# Patient Record
Sex: Male | Born: 1950 | Race: White | Hispanic: No | Marital: Married | Smoking: Former smoker
Health system: Southern US, Community
[De-identification: ages and names within clinical notes are randomized; demographics above are authoritative.]

## PROBLEM LIST (undated history)

## (undated) DIAGNOSIS — Z5189 Encounter for other specified aftercare: Secondary | ICD-10-CM

## (undated) DIAGNOSIS — F329 Major depressive disorder, single episode, unspecified: Secondary | ICD-10-CM

## (undated) DIAGNOSIS — K6289 Other specified diseases of anus and rectum: Secondary | ICD-10-CM

## (undated) DIAGNOSIS — T7840XA Allergy, unspecified, initial encounter: Secondary | ICD-10-CM

## (undated) DIAGNOSIS — M1990 Unspecified osteoarthritis, unspecified site: Secondary | ICD-10-CM

## (undated) DIAGNOSIS — I10 Essential (primary) hypertension: Secondary | ICD-10-CM

## (undated) DIAGNOSIS — F419 Anxiety disorder, unspecified: Secondary | ICD-10-CM

## (undated) DIAGNOSIS — F32A Depression, unspecified: Secondary | ICD-10-CM

## (undated) HISTORY — DX: Encounter for other specified aftercare: Z51.89

## (undated) HISTORY — DX: Anxiety disorder, unspecified: F41.9

## (undated) HISTORY — DX: Major depressive disorder, single episode, unspecified: F32.9

## (undated) HISTORY — DX: Depression, unspecified: F32.A

## (undated) HISTORY — DX: Essential (primary) hypertension: I10

## (undated) HISTORY — DX: Allergy, unspecified, initial encounter: T78.40XA

## (undated) HISTORY — DX: Other specified diseases of anus and rectum: K62.89

## (undated) HISTORY — DX: Unspecified osteoarthritis, unspecified site: M19.90

## (undated) HISTORY — PX: HERNIA REPAIR: SHX51

## (undated) HISTORY — PX: FRACTURE SURGERY: SHX138

---

## 1999-06-24 ENCOUNTER — Encounter: Admission: RE | Admit: 1999-06-24 | Discharge: 1999-06-24 | Payer: Self-pay | Admitting: Internal Medicine

## 2006-07-28 ENCOUNTER — Ambulatory Visit: Payer: Self-pay | Admitting: Internal Medicine

## 2006-08-10 ENCOUNTER — Ambulatory Visit: Payer: Self-pay

## 2006-09-02 ENCOUNTER — Ambulatory Visit: Payer: Self-pay | Admitting: Internal Medicine

## 2006-11-25 ENCOUNTER — Ambulatory Visit: Payer: Self-pay | Admitting: Internal Medicine

## 2006-12-01 ENCOUNTER — Ambulatory Visit: Payer: Self-pay | Admitting: Internal Medicine

## 2007-09-01 ENCOUNTER — Ambulatory Visit: Payer: Self-pay | Admitting: Internal Medicine

## 2008-08-22 ENCOUNTER — Ambulatory Visit: Payer: Self-pay | Admitting: Internal Medicine

## 2008-08-22 LAB — CONVERTED CEMR LAB
ALT: 37 units/L (ref 0–53)
Alkaline Phosphatase: 47 units/L (ref 39–117)
Bilirubin, Direct: 0.2 mg/dL (ref 0.0–0.3)
CO2: 33 meq/L — ABNORMAL HIGH (ref 19–32)
Calcium: 9.4 mg/dL (ref 8.4–10.5)
Glucose, Bld: 138 mg/dL — ABNORMAL HIGH (ref 70–99)
HDL: 39.6 mg/dL (ref >39.0)
Sodium: 142 meq/L (ref 135–145)
Total Protein: 7.1 g/dL (ref 6.0–8.3)
Triglycerides: 77 mg/dL (ref 0–149)

## 2008-08-24 ENCOUNTER — Ambulatory Visit: Payer: Self-pay | Admitting: Internal Medicine

## 2009-06-28 ENCOUNTER — Ambulatory Visit: Payer: Self-pay | Admitting: Internal Medicine

## 2009-07-04 ENCOUNTER — Ambulatory Visit: Payer: Self-pay | Admitting: Internal Medicine

## 2009-07-04 DIAGNOSIS — E785 Hyperlipidemia, unspecified: Secondary | ICD-10-CM | POA: Insufficient documentation

## 2009-07-04 DIAGNOSIS — I10 Essential (primary) hypertension: Secondary | ICD-10-CM | POA: Insufficient documentation

## 2009-07-04 LAB — CONVERTED CEMR LAB
ALT: 25 units/L (ref 0–53)
AST: 24 units/L (ref 0–37)
Alkaline Phosphatase: 63 units/L (ref 39–117)
BUN: 17 mg/dL (ref 6–23)
Bilirubin, Direct: 0.2 mg/dL (ref 0.0–0.3)
Calcium: 9.5 mg/dL (ref 8.4–10.5)
Cholesterol: 147 mg/dL (ref 0–200)
Creatinine, Ser: 0.8 mg/dL (ref 0.4–1.5)
GFR calc non Af Amer: 106.2 mL/min (ref >60)
Potassium: 4.3 meq/L (ref 3.5–5.1)
Total Protein: 7.1 g/dL (ref 6.0–8.3)

## 2009-10-18 ENCOUNTER — Encounter (INDEPENDENT_AMBULATORY_CARE_PROVIDER_SITE_OTHER): Payer: Self-pay | Admitting: *Deleted

## 2010-04-16 ENCOUNTER — Encounter: Payer: Self-pay | Admitting: Internal Medicine

## 2010-05-01 ENCOUNTER — Encounter: Payer: Self-pay | Admitting: Internal Medicine

## 2010-12-17 NOTE — Letter (Signed)
Summary: lab results  [facility], Main Office  [address]   [place], [place] [address]   Phone: [phone]  Fax: [phone]        May 01, 2010 MRN: [id number]    [name] [name] [address] [place], [place]  [address]    Dear [name],  We have been unable to reach you by phone regarding lab results.  Please give our office a call to discuss your results.     Sincerely,  Meredith Staggers, RN Arvilla Meres, MD  This letter has been electronically signed by your physician.

## 2011-04-01 NOTE — Assessment & Plan Note (Signed)
[facility]                            CARDIOLOGY OFFICE NOTE   [name] [name], [name] [name]                    MRN:          [id number]  DATE:08/24/2008                            DOB:          01-07-1951    PRIMARY CARE PHYSICIAN:  [name] Raveling, PA   INTERVAL HISTORY:  [name] [name] is a very pleasant 60 year old male with  history of hypertension, hyperlipidemia, obesity, obstructive sleep  apnea, and previous tobacco use who returns today for routine followup.   Overall, he has been doing fairly well.  Unfortunately, there has been  some illness in his family and he has not been as compliant with his  weight loss program he would like.  He has gained about 10 or 15 pounds  over the last few months.  He has been taking his blood pressure  regularly and systolics had been running in the 145s with diastolics  back in the 80s.  He denies any chest pain or shortness of breath.  He  did have blood work drawn couple of days ago which showed blood glucose  of 138, cholesterol of 143, triglycerides 107, HDL 45, and LDL 137, and  these were not strictly fasting, did have appearance of milk beforehand.   REVIEW OF SYSTEMS:  Otherwise negative.   CURRENT MEDICATIONS:  1. Lexapro 10 mg a day.  2. Benadryl 25 at night.  3. Claritin.  4. Aspirin 81 b.i.d.  5. B complex.  6. Vitamin.  7. Norvasc 10 a day.  8. Hyzaar 100/25.  9. Fish oil 3 g b.i.d.  10.Labetalol 200 b.i.d.   ALLERGIES/ INTOLERANCES:  SEPTRA DS, SIMVASTATIN, and PRAVACHOL caused  memory problems.   PHYSICAL EXAMINATION:  GENERAL:  He is well-appearing in no acute  distress, ambulates around the clinic without any respiratory  difficulty.  VITAL SIGNS:  Blood pressure is 134/80, heart rate 63, weight is 268.  HEENT:  Normal.  NECK:  Supple.  No obvious JVD.  Carotids are 2+ bilaterally without  bruits.  There is no lymphadenopathy or thyromegaly.  CARDIAC:  PMI is nondisplaced.  He is  regular with no murmurs, rubs, or  gallops.  LUNGS:  Clear.  ABDOMEN:  Obese, good bowel sounds, nontender, no obvious  hepatosplenomegaly.  EXTREMITIES:  Warm with no cyanosis, clubbing, or edema.  No rash.  NEURO:  Alert and oriented x3.  Cranial nerves II through XII are  intact.  Moves all 4 extremities without difficulty.  Affect is  pleasant.   EKG shows sinus rhythm at rate of 63.  No ST-T wave abnormalities.   ASSESSMENT AND PLAN:  1. Hyperlipidemia.  His LDL is not at goal.  He is unfortunately      unable to tolerate statins previously.  We will try him on very low-      dose Crestor 5 mg a day with the goal of getting his LDL under 100,      if he is unable tolerate this we have talked about Zetia.  2. Hypertension.  Blood pressure is elevated.  We will start      spironolactone 25  a day.  He will have followup on his electrolytes      within 2 weeks with his primary care physician.  3. Hyperglycemia.  Although, this was not strictly fasting.  His      glucose is elevated.  I discussed this with him.  He does have a      monitor at home and will check his blood sugars and follow up with      Ms. [name] [name].  He is hopefully motivated to be more compliant with      diet and exercise and an effort to lose weight and control his      sugars.  We did discuss briefly the diabetes lifestyle program in      consideration of early therapy with metformin progression to formal      diabetes.  I will leave this to Ms. [name] [name] to the side.   DISPOSITION:  We will see him back in 4-5 months with repeat lipid  profile, hemoglobin A1c, and CRP.     [name] Buckles. Bensimhon, MD  Electronically Signed    DRB/MedQ  DD: 08/24/2008  DT: 08/25/2008  Job #: [id number]

## 2011-04-01 NOTE — Assessment & Plan Note (Signed)
[facility]                            CARDIOLOGY OFFICE NOTE   [name], [name]                    MRN:          [id number]  DATE:09/01/2007                            DOB:          [birth year]    PRIMARY CARE PHYSICIAN:  Dr. Tracey [name].   INTERVAL HISTORY:  Overall he is a very pleasant 60 year old male who is  good friends with [name] [name] who presents today for a routine  followup.  He has a history of hypertension, hyperlipidemia, obesity,  obstructive sleep apnea and tobacco use which he has now quit.   He is doing fairly well although he has been under a tremendous amount  of stress as his niece just got into a severe motor vehicle accident, is  in the Pediatric ICU at [facility] with a severe head injury.  He has been  taking his blood pressure with his wife's cuff and notes over the past  few weeks it has been running in the 130-150 range, but he is not sure  if this is accurate.  He has been compliant with all his medications.  He did stop exercising for a while because he has hurt his knee but  recently got a cortisone shot and is back to exercising.  He continues  to ride his bike back and forth to work.   CURRENT MEDICATIONS:  1. Multivitamin.  2. Coenzyme Q10.  3. Fish oil.  4. Labetalol 200 b.i.d.  5. Norvasc 10 a day.  6. Aspirin 81 a day.  7. Hyzaar/HCTZ 100/25.   PHYSICAL EXAMINATION:  He is well-appearing, no acute distress,  ambulates around the clinic without any respiratory difficulty.  Blood  pressure is initially 112/72, on manual recheck 126/72.  Heart rate is  67, weight is 272 which is stable.  HEENT:  Normal.  NECK:  Supple, no JVD, carotids are 2+ bilaterally without any bruits,  there is no lymphadenopathy or thyromegaly.  CARDIAC:  PMI is nondisplaced, regular rate and rhythm; no murmurs,  rubs, or gallops.  LUNGS:  Clear.  ABDOMEN:  Obese, nontender, nondistended, no hepatosplenomegaly, no  bruits, no  masses, good bowel sounds.  EXTREMITIES:  Warm with no cyanosis, clubbing or edema.  NEURO:  He is alert and oriented x3, cranial nerves II through XII are  intact, moves all 4 extremities without difficulty.  Affect is pleasant.   EKG shows sinus rhythm at a rate of 67, no ST-T wave abnormalities.   ASSESSMENT/PLAN:  1. Hypertension.  Blood pressure seems to be a bit elevated at home      but it is doing very well here.  I have encouraged him to bring his      cuff in to be checked.  He will do this.  We will continue      medications where they are at for now.  2. Hyperlipidemia.  He stopped Pravachol due to mental status changes.      He did increase his fish oil, we will recheck his lipids.  I had      previously given him a prescription for low dose  Crestor but he is      going to hold off on this until we see where he is at.  Could also      consider Zetia.   DISPOSITION:  Return to clinic in 6 months.     [name] Buckles. Bensimhon, MD  Electronically Signed    DRB/MedQ  DD: 09/01/2007  DT: 09/02/2007  Job #: [id number]   cc:   [name] [name], M.D.

## 2011-04-04 NOTE — Assessment & Plan Note (Signed)
[name] [name]                              CARDIOLOGY OFFICE NOTE   [name] [name], [name] [name]                    MRN:          [id number]  DATE:07/28/2006                            DOB:          1951/07/28    REFERRING PHYSICIAN:  Tracey Harries, M.D.   CARDIOLOGY CONSULT.   PATIENT INFORMATION:  [name] [name] is a very pleasant 60 year old male who  presents for routine cardiac evaluation.   HISTORY OF PRESENT ILLNESS:  [name] [name] is 60 years old.  He has a history  of hypertension, sleep apnea and obesity.  He denies any known history of  heart disease.  Thirty years ago he did undergo a full workup, including  echocardiogram, treadmill and 24 hour Holter monitor as part of what he says  was just a routine screening workup.  He denies any chest pain at the time.  He has been following with [name] [name], who has been looking after his  hypertension.  Most recently he has made an attempt to lose weight and has  begun riding his bike one and a half miles each way to work.  He has lost  about 30 pounds over the last year.  He has not had any problems with heart  failure, palpitations or presyncope.  He has been taking his blood pressure  somewhat regularly and it has remained elevated.  Unfortunately, he does  smoke on a regular basis and he is attempting to quit fully.   REVIEW OF SYSTEMS:  He denies any syncope or presyncope.  No claudication.  No lightheadedness.  He has not had any bowel or bladder problems.  He has  had problems with arthritis as well as depression.   PAST MEDICAL HISTORY:  1. Hypertension times 20 years.  2. Obesity.  3. Obstructive sleep apnea on BiPAP.  4. Depression.  5. Mild hyperlipidemia.  Total cholesterol 195, triglycerides 101, ACL 46,      LDL 129.  6. Tobacco use, ongoing.   CURRENT MEDICATIONS:  1. HCTZ 25 mg a day.  2. Cozaar 100 mg a day.  3. Amlodipine 5 a day.  4. Aspirin 325 b.i.d.  5. Multivitamin.  6.  Coenzyme q.10.  7. Claritin.  8. Benadryl.  9. Fish oil 1000 mg b.i.d.   ALLERGIES:  SEPTRA DS, ACE INHIBITOR causes a cough, CLONIDINE causes severe  erectile dysfunction.   SOCIAL HISTORY:  He is married with no children.  He works as a English as a second language teacher at [employer], focusing on Psychiatrist.  He smokes about a  quarter of a pack of cigarettes a day.  He has been smoking for 30 years up  to a pack a day.  Alcohol:  He drinks less than two drinks a day.   FAMILY HISTORY:  Mother is alive at age 36.  She has a history of  hypertension.  Father died at 24.  He was a three pack a day smoker and died  of bladder cancer.  He has a sister who is 83 and otherwise is healthy.   PHYSICAL EXAMINATION:  GENERAL:  He  is well-appearing, in no acute distress.  Ambulation in the clinic without any respiratory difficulty.  VITAL SIGNS:  Blood pressure 151/82, heart rate 89, weight 259.  HEENT:  Sclera anicteric.  EOMI.  There is no __________ .  Mucus membranes are moist.  NECK:  Supple.  There is no JVD.  Carotids are 2+ bilaterally, no bruits.  There is no  lymphadenopathy or thyromegaly.  CARDIAC:  Regular rate and rhythm, plus S4.  No murmurs or rubs.  LUNGS:  Clear.  ABDOMEN:  Obese, nontender,  nondistended.  No hepatosplenomegaly.  No bruits, no masses.  Good bowel  sounds.  EXTREMITIES:  Warm with no clubbing, cyanosis or edema.  Strong  distal pulses.  NEUROLOGIC:  He is alert and oriented times three.  Very  pleasant affect.  Cranial nerves II through XII are intact.  He moves all  four extremities without difficulty.   LABORATORY DATA:  EKG shows normal sinus rhythm with left anterior vesicular  block.  Rate of 89.  No ST T-wave abnormalities.   ASSESSMENT AND PLAN:  1. Cardiac screening.  Overall he seems to be doing fairly well.  I      congratulated him on his exercise program and asked him to continue      this.  Given his risk factors, I do think it is reasonable to  proceed      with a screening treadmill Myoview to evaluate for underlying coronary      disease.  2. Hypertension poorly controlled.  We will go ahead and add labetalol.  3. Hyperlipidemia.  This is mild.  The patient is to be more aggressive      with his diet and exercise program.   DISPOSITION:  We will see him back in a month or two for continued treatment  of his hypertension.                                [name] Buckles. Bensimhon, MD    DRB/MedQ  DD:  07/28/2006  DT:  07/29/2006  Job #:  [id number]

## 2011-04-04 NOTE — Assessment & Plan Note (Signed)
[facility]                            CARDIOLOGY OFFICE NOTE   AASIM, RESTIVO                    MRN:          [id number]  DATE:12/01/2006                            DOB:          July 31, 1951    PRIMARY CARE PHYSICIAN:  Dr. Tracey Harries.   PATIENT IDENTIFICATION:  [name] [name] is a very pleasant 60 year old male who  is good friends with Denice Paradise who presents for routine followup  here.  He has a history of hypertension, hyperlipidemia, obesity  obstructive sleep apnea, as well as tobacco use which he just quit.   He says that he is doing fairly well, he denies any chest pain or  shortness of breath.  His blood pressure has come under much better  control but still is not ideal with blood pressures in the 130 to over  80 range.  He did initially try Chantix but became quite hostile with it  and had to switch over to the patch.  He has been quit for 3 weeks.  He  also noticed that on 20 mg of simvastatin he felt that he was becoming  progressively more stupid and cut down to 10, and finally had to titrate  down to 5.  Now he feels like he can tolerate that well.  He denies any  heart failure symptoms.   CURRENT MEDICATIONS:  1. Multivitamin.  2. Coenzyme Q-10.  3. Fish oil.  4. Labetalol 100 mg b.i.d.  5. Norvasc 10 mg a day.  6. Simvastatin 5 mg a day.  7. Aspirin 81 mg.  8. Hyzaar HCT 100/25 mg.   PHYSICAL EXAMINATION:  He is well-appearing, in no acute distress,  ambulates around the clinic without respiratory difficulty.  Blood  pressure is 138/88.  Heart rate is 74, weight is 273.  HEENT:  Sclerae anicteric, EOMI, there is no xanthelasmas, mucus  membranes are moist.  NECK:  Supple, there is no JVD, carotids 2+ bilaterally in bruits, no  lymphadenopathy or thyromegaly.  CARDIAC:  Regular rate and rhythm, no murmurs, rubs, or gallops.  LUNGS:  Clear.  ABDOMEN:  Obese, nontender, nondistended, no obvious hepatosplenomegaly,  no  bruits, no masses, good bowel sounds.  EXTREMITIES:  Warm with no cyanosis, clubbing, or edema.  NEURO:  Alert and oriented x3, cranial nerves II-XII are intact, moves  all 4 extremities without difficulty, affect is pleasant.   LABORATORY TESTS:  Show a total cholesterol of 143, triglycerides of  107, HDL of 45, LDL of 77.  EKG shows normal sinus rhythm with left axis  deviation, no ST-T wave changes.   ASSESSMENT/PLAN:  1. Hypertension.  Improved but still not at goal, we will increase his      labetalol to 200 b.i.d.  2. Hyperlipidemia, numbers look good but he is interested in seeing if      there is a statin which may effect his mental status less, and we      will try to switch him over to Pravachol 20.  3. Smoking.  I congratulated him on his quitting.  He is doing his  best to watch his weight.   DISPOSITION:  Return to clinic in 6 months for routine followup.  He  will call me sooner if he has any problems.     Bevelyn Buckles. Bensimhon, MD  Electronically Signed    DRB/MedQ  DD: 12/01/2006  DT: 12/02/2006  Job #: [id number]

## 2011-04-04 NOTE — Assessment & Plan Note (Signed)
[name]                              CARDIOLOGY OFFICE NOTE   [name], [name] [name]                    MRN:          [id number]  DATE:09/02/2006                            DOB:          June 26, 1951    PRIMARY CARE PHYSICIAN:  Dr. Tracey Harries.   PATIENT IDENTIFICATION:  [name] [name] is a very pleasant 60 year old male who  is good friends with [name] [name], who presents for routine followup.   PROBLEM LIST:  1. Hypertension.  2. Obesity.  3. Obstructive sleep apnea on BiPAP.  4. Depression.  5. Mild hyperlipidemia with an LDL or 129.  6. Tobacco use, ongoing.  7. Normal exercise Myoview August 10, 2006.  Exercise time 8 minutes on      a Bruce protocol.  Blood pressure 228/103, EF of 56% with no      significant perfusion defects.   CURRENT MEDICATIONS:  1. Hydrochlorothiazide 25.  2. Cozaar 100.  3. Amlodipine 5.  4. Aspirin 325 b.i.d.  5. Multivitamin.  6. Coenzyme Q10.  7. Fish oil 1000 b.i.d.  8. Labetalol 100 q. day.   INTERVAL HISTORY:  [name] [name] returns today for routine followup.  He is  doing well.  He denies any chest pain or shortness of breath.  After  starting labetalol he did notice that his feet were getting a little cold  and he had some mild erectile dysfunction.  Other than that, he has no  significant complaints.  He brings a detailed list of his blood pressures  with a mean blood pressure in the high 130s to low 140 range.   PHYSICAL EXAM:  He is well-appearing.  No acute distress.  Respirations are  unlabored.  Blood pressure is 128/75, heart rate 62, his weight is 257.  HEENT:  Sclerae anicteric.  EOMI.  There is no xanthelasma.  Mucous  membranes are moist.  NECK:  Supple.  No JVD.  Carotids are 2+ bilaterally without bruits.  There  is no lymphadenopathy or thyromegaly.  CARDIAC:  Regular rate and rhythm with no murmur, rub, or gallop_.  LUNGS:  Clear to auscultation.  ABDOMEN:  Obese, nontender,  nondistended.  No hepatosplenomegaly.  No  bruits.  No masses.  EXTREMITIES:  Warm with no cyanosis, clubbing, or edema.  NEURO:  He is alert and oriented x3.  Cranial nerves 2-12 are intact.  Moves  all 4 extremities without difficulty.   ASSESSMENT AND PLAN:  1. Hypertension.  Blood pressure is improved, but still not quite at goal.      We will increase his amlodipine to 10 mg a day.  I told him to watch      his side effects closely on the labetalol and we can stop this as      necessary.  He has had problems with clonidine in the past as well for      erectile dysfunction.  2. Hyperlipidemia.  I suggested that we should start a low-dose Statin,      simvastatin 20 mg a day to brink his LDL under 100 given his risk  factors.  3. Tobacco use.  We once again discussed the need to quit.  4. Obesity.  He continues to get exercise on his bike.  I reinforced the      need for weight loss and exercise.   DISPOSITION:  See him back in several months for routine followup.  Of note,  I also suggested that he decrease his aspirin to 81 mg a day and Tylenol as  needed for his arthritis.       [name] Buckles. Bensimhon, MD     DRB/MedQ  DD:  09/02/2006  DT:  09/03/2006  Job #:  [id number]   cc:   Tracey Harries, M.D.

## 2012-01-31 ENCOUNTER — Other Ambulatory Visit: Payer: Self-pay | Admitting: Family Medicine

## 2012-01-31 MED ORDER — LOSARTAN POTASSIUM-HCTZ 100-25 MG PO TABS: 1.0000 | ORAL_TABLET | Freq: Every day | ORAL | Status: DC

## 2012-01-31 MED ORDER — LABETALOL HCL 200 MG PO TABS: 200.0000 mg | ORAL_TABLET | Freq: Two times a day (BID) | ORAL | Status: DC

## 2012-01-31 MED ORDER — AMLODIPINE BESYLATE 10 MG PO TABS: 10.0000 mg | ORAL_TABLET | Freq: Every day | ORAL | Status: DC

## 2012-02-04 ENCOUNTER — Telehealth: Payer: Self-pay

## 2012-02-04 NOTE — Telephone Encounter (Signed)
Pt left referral request on referrals vmail Pt would like a referral to a dermatologist for various skin problems  Best: [phone]  bf

## 2012-02-04 NOTE — Telephone Encounter (Signed)
lmom to cb. 

## 2012-02-05 ENCOUNTER — Ambulatory Visit (INDEPENDENT_AMBULATORY_CARE_PROVIDER_SITE_OTHER): Payer: BC Managed Care – PPO | Admitting: Family Medicine

## 2012-02-05 VITALS — BP 144/87 | HR 88 | Temp 99.1°F | Resp 18 | Ht 71.5 in | Wt 245.0 lb

## 2012-02-05 DIAGNOSIS — L989 Disorder of the skin and subcutaneous tissue, unspecified: Secondary | ICD-10-CM

## 2012-02-05 MED ORDER — DOXYCYCLINE HYCLATE 100 MG PO TABS: 100.0000 mg | ORAL_TABLET | Freq: Two times a day (BID) | ORAL | Status: AC

## 2012-02-05 NOTE — Progress Notes (Signed)
  Subjective:    Patient ID: [name] [name], male    DOB: 02/04/1951, 61 y.o.   MRN: [id number]  HPI 61 yo male here with perianal skin issue.  Pilonidal cyst about 10 years ago.  Had another abscess in August.  Treated with Doxy.  Would go away, but has continued to come back and go away occasionally.  For 2 weeks, new lesion and has been draining.  Feels run down.  Possibly subjective fevers but hasn't measured.   2 weeks of antibiotics has never fully knocked it.    Review of Systems Negative except as per HPI     Objective:   Physical Exam  Constitutional: He appears well-developed.  Pulmonary/Chest: Effort normal.  Neurological: He is alert.   Left buttock, around 7 o'clock, about 1.5 -2 inches from anus, small, indurated lesion.  Mild TTP.  No fluctance or surrounding erythema.         Assessment & Plan:  Chronic boil - try extended course of doxy.  If still no help consider surgery or derm referral.

## 2012-02-06 NOTE — Telephone Encounter (Signed)
Patient came in and was seen on 3/21.  Disregard.

## 2012-03-03 ENCOUNTER — Other Ambulatory Visit: Payer: Self-pay | Admitting: Physician Assistant

## 2012-03-03 NOTE — Telephone Encounter (Signed)
Needs OV - 2nd notice 

## 2012-03-23 ENCOUNTER — Ambulatory Visit (INDEPENDENT_AMBULATORY_CARE_PROVIDER_SITE_OTHER): Payer: BC Managed Care – PPO | Admitting: Internal Medicine

## 2012-03-23 VITALS — BP 126/74 | HR 67 | Temp 98.0°F | Resp 18 | Ht 71.5 in | Wt 241.0 lb

## 2012-03-23 DIAGNOSIS — F329 Major depressive disorder, single episode, unspecified: Secondary | ICD-10-CM

## 2012-03-23 DIAGNOSIS — G4730 Sleep apnea, unspecified: Secondary | ICD-10-CM | POA: Insufficient documentation

## 2012-03-23 DIAGNOSIS — Z6833 Body mass index (BMI) 33.0-33.9, adult: Secondary | ICD-10-CM | POA: Insufficient documentation

## 2012-03-23 DIAGNOSIS — N529 Male erectile dysfunction, unspecified: Secondary | ICD-10-CM | POA: Insufficient documentation

## 2012-03-23 DIAGNOSIS — E785 Hyperlipidemia, unspecified: Secondary | ICD-10-CM

## 2012-03-23 DIAGNOSIS — K6289 Other specified diseases of anus and rectum: Secondary | ICD-10-CM

## 2012-03-23 DIAGNOSIS — I10 Essential (primary) hypertension: Secondary | ICD-10-CM

## 2012-03-23 DIAGNOSIS — F339 Major depressive disorder, recurrent, unspecified: Secondary | ICD-10-CM | POA: Insufficient documentation

## 2012-03-23 LAB — POCT CBC
HCT, POC: 42 % — AB (ref 43.5–53.7)
Lymph, poc: 2.3 (ref 0.6–3.4)
MCHC: 33.1 g/dL (ref 31.8–35.4)
MCV: 98.8 fL — AB (ref 80–97)
POC LYMPH PERCENT: 28.9 %L (ref 10–50)
RDW, POC: 14.5 %

## 2012-03-23 LAB — COMPREHENSIVE METABOLIC PANEL
CO2: 30 mEq/L (ref 19–32)
Creat: 0.83 mg/dL (ref 0.50–1.35)
Glucose, Bld: 109 mg/dL — ABNORMAL HIGH (ref 70–99)
Sodium: 140 mEq/L (ref 135–145)
Total Bilirubin: 0.4 mg/dL (ref 0.3–1.2)
Total Protein: 7.2 g/dL (ref 6.0–8.3)

## 2012-03-23 LAB — PSA: PSA: 2.87 ng/mL (ref <=4.00)

## 2012-03-23 LAB — C-REACTIVE PROTEIN: CRP: 0.55 mg/dL (ref <0.60)

## 2012-03-23 MED ORDER — LABETALOL HCL 200 MG PO TABS: 200.0000 mg | ORAL_TABLET | Freq: Two times a day (BID) | ORAL | Status: DC

## 2012-03-23 MED ORDER — ESCITALOPRAM OXALATE 20 MG PO TABS: 20.0000 mg | ORAL_TABLET | Freq: Every day | ORAL | Status: DC

## 2012-03-23 MED ORDER — AMLODIPINE BESYLATE 10 MG PO TABS: 10.0000 mg | ORAL_TABLET | Freq: Every day | ORAL | Status: DC

## 2012-03-23 MED ORDER — LOSARTAN POTASSIUM-HCTZ 100-25 MG PO TABS: 1.0000 | ORAL_TABLET | Freq: Every day | ORAL | Status: DC

## 2012-03-23 MED ORDER — DOXYCYCLINE HYCLATE 100 MG PO TABS: 100.0000 mg | ORAL_TABLET | Freq: Two times a day (BID) | ORAL | Status: AC

## 2012-03-23 NOTE — Progress Notes (Signed)
  Subjective:    Patient ID: [name] [name], male    DOB: 1951-06-14, 61 y.o.   MRN: [id number]  HPIHere for followup, med refills, and lab work Patient Active Problem List  Diagnoses  . HYPERLIPIDEMIA-MIXED  . HYPERTENSION, BENIGN  . Depression, recurrent  . Sleep apnea  . BMI 33.0-33.9,adult  . ED (erectile dysfunction)  He also is here for followup of his chronic rectal cyst with secondary infection. He has been on doxycycline for 6 weeks from Dr. Georgiana Shore And this has not resolved. He would like to pursue a surgical evaluation for removal.  He continues on 3 medications for hypertension and he has regular visits with Dr. Gala Romney- cardiology.His cholesterol has not been high enough to require medications for years. His weight has dropped from 291-241 over the past 6 years. He reports no symptoms with his sleep apnea.  He has had recurrent depression since childhood and has cycled through many SSRIs. His recent restart of Lexapro 6 months ago has not been as successful as in past years and he worries about whether he could have a thyroid dysfunction.     Review of Systems  Constitutional: Negative for fever, activity change, appetite change and fatigue.  Eyes: Negative for photophobia and visual disturbance.  Respiratory: Negative for chest tightness and shortness of breath.   Cardiovascular: Negative for chest pain, palpitations and leg swelling.  Gastrointestinal: Negative for abdominal pain, diarrhea and constipation.  Genitourinary: Negative for frequency and difficulty urinating.  Musculoskeletal: Negative for gait problem.  Skin: Negative for rash.  Neurological: Negative for headaches.  Psychiatric/Behavioral: Negative for suicidal ideas, hallucinations, self-injury, decreased concentration and agitation.       Objective:   Physical Exam  Constitutional: He is oriented to person, place, and time. He appears well-developed and well-nourished.       BMI 33  HENT:    Head: Normocephalic.  Eyes: Conjunctivae and EOM are normal. Pupils are equal, round, and reactive to light.  Neck: Normal range of motion. Neck supple. No thyromegaly present.  Cardiovascular: Normal rate and regular rhythm.   Musculoskeletal: He exhibits no edema.  Lymphadenopathy:    He has no cervical adenopathy.  Neurological: He is alert and oriented to person, place, and time. No cranial nerve deficit.  Psychiatric: He has a normal mood and affect. His behavior is normal. Thought content normal.          Assessment & Plan:   1. Depressed  TSH, PSA, escitalopram (LEXAPRO) 20 MG tablet, DISCONTINUED: escitalopram (LEXAPRO) 20 MG tablet  2. HTN (hypertension)  POCT CBC, Comprehensive metabolic panel, C-reactive protein, losartan-hydrochlorothiazide (HYZAAR) 100-25 MG per tablet, labetalol (NORMODYNE) 200 MG tablet, amLODipine (NORVASC) 10 MG tablet, DISCONTINUED: amLODipine (NORVASC) 10 MG tablet, DISCONTINUED: labetalol (NORMODYNE) 200 MG tablet, DISCONTINUED: losartan-hydrochlorothiazide (HYZAAR) 100-25 MG per tablet  3. Rectal cyst  Ambulatory referral to General Surgery, doxycycline (VIBRA-TABS) 100 MG tablet  4. BMI 33.0-33.9,adult    5. HYPERLIPIDEMIA-MIXED     Recheck in 6 month/Mail lab results

## 2012-03-24 ENCOUNTER — Encounter: Payer: Self-pay | Admitting: Internal Medicine

## 2012-04-16 ENCOUNTER — Ambulatory Visit (INDEPENDENT_AMBULATORY_CARE_PROVIDER_SITE_OTHER): Payer: BC Managed Care – PPO | Admitting: Surgery

## 2012-04-16 ENCOUNTER — Encounter (INDEPENDENT_AMBULATORY_CARE_PROVIDER_SITE_OTHER): Payer: Self-pay | Admitting: Surgery

## 2012-04-16 VITALS — BP 146/78 | HR 94 | Temp 98.1°F | Ht 72.0 in | Wt 238.8 lb

## 2012-04-16 DIAGNOSIS — L732 Hidradenitis suppurativa: Secondary | ICD-10-CM

## 2012-04-16 NOTE — Progress Notes (Signed)
Patient ID: [name] [name], male   DOB: 08/15/1951, 61 y.o.   MRN: [id number]  Chief Complaint  Patient presents with  . Pre-op Exam    eval rectal cyst    HPI [name] [name] is a 61 y.o. male.   HPIPatient presents at the request of Dr. Merla Riches due to  drainage from his left perianal region.  This has been going on intermittently for 2 years. He has an area on the left perianal region at about 4:00 and is open and draining. He gets better with antibiotics. He denies fever or chills. She has a history of hidradenitis. Pelvic, the drainage is minimal it is yellow in nature and is having no significant pain. No history of perirectal abscess.  Past Medical History  Diagnosis Date  . Arthritis   . Hypertension     Past Surgical History  Procedure Date  . Hernia repair     umb hernia at birth    Family History  Problem Relation Age of Onset  . Hypertension Mother   . Cancer Father     bladder  . Cancer Sister     breast    Social History History  Substance Use Topics  . Smoking status: Former Games developer  . Smokeless tobacco: Former Neurosurgeon    Quit date: 04/16/2010  . Alcohol Use: No    Allergies  Allergen Reactions  . Sulfamethoxazole W-Trimethoprim     Current Outpatient Prescriptions  Medication Sig Dispense Refill  . amLODipine (NORVASC) 10 MG tablet Take 1 tablet (10 mg total) by mouth daily.  30 tablet  5  . doxycycline (VIBRAMYCIN) 100 MG capsule       . escitalopram (LEXAPRO) 20 MG tablet Take 1 tablet (20 mg total) by mouth daily.  30 tablet  5  . fish oil-omega-3 fatty acids 1000 MG capsule Take 2 g by mouth daily.      Marland Kitchen labetalol (NORMODYNE) 200 MG tablet Take 1 tablet (200 mg total) by mouth 2 (two) times daily.  60 tablet  5  . losartan-hydrochlorothiazide (HYZAAR) 100-25 MG per tablet Take 1 tablet by mouth daily.  30 tablet  5  . Naproxen Sodium (ALEVE PO) Take by mouth daily.      . NON FORMULARY cucurmin 500 mg BID        Review of Systems Review  of Systems  Constitutional: Negative for fever, chills and unexpected weight change.  HENT: Negative for hearing loss, congestion, sore throat, trouble swallowing and voice change.   Eyes: Negative for visual disturbance.  Respiratory: Negative for cough and wheezing.   Cardiovascular: Negative for chest pain, palpitations and leg swelling.  Gastrointestinal: Negative for nausea, vomiting, abdominal pain, diarrhea, constipation, blood in stool, abdominal distention, anal bleeding and rectal pain.  Genitourinary: Negative for hematuria and difficulty urinating.  Musculoskeletal: Negative for arthralgias.  Skin: Negative for rash and wound.  Neurological: Negative for seizures, syncope, weakness and headaches.  Hematological: Negative for adenopathy. Does not bruise/bleed easily.  Psychiatric/Behavioral: Negative for confusion.    Blood pressure 146/78, pulse 94, temperature 98.1 F (36.7 C), temperature source Temporal, height 6' (1.829 m), weight 238 lb 12.8 oz (108.319 kg), SpO2 96.00%.  Physical Exam Physical Exam  Constitutional: He is oriented to person, place, and time. He appears well-developed and well-nourished.  HENT:  Head: Normocephalic and atraumatic.  Eyes: EOM are normal. Pupils are equal, round, and reactive to light.  Neck: Normal range of motion. Neck supple.  Cardiovascular: Normal rate and  regular rhythm.   Pulmonary/Chest: Effort normal and breath sounds normal.  Abdominal: Soft. Bowel sounds are normal.  Genitourinary:     Musculoskeletal: Normal range of motion.  Neurological: He is alert and oriented to person, place, and time.  Skin: Skin is warm and dry.  Psychiatric: He has a normal mood and affect. His behavior is normal. Judgment normal.    Data Reviewed notes  Assessment    Draining tracks left perianal region    Plan    Recommend exam under anesthesia. I discussed possibilities of anal fistula as well as hidradenitis. Excision with  possible fistulotomy recommended. Risk of bleeding, infection, the need for other surgical procedures, injury to the sphincter for fecal continence, incontinence, and recurrence of condition and damaged neighboring structures discussed.       Shironda Kain A. 04/16/2012, 10:02 AM

## 2012-04-16 NOTE — Patient Instructions (Signed)
Hidradenitis Suppurativa, Sweat Gland Abscess Hidradenitis suppurativa is a long lasting (chronic), uncommon disease of the sweat glands. With this, boil-like lumps and scarring develop in the groin, some times under the arms (axillae), and under the breasts. It may also uncommonly occur behind the ears, in the crease of the buttocks, and around the genitals.  CAUSES  The cause is from a blocking of the sweat glands. They then become infected. It may cause drainage and odor. It is not contagious. So it cannot be given to someone else. It most often shows up in puberty (about 10 to 59 years of age). But it may happen much later. It is similar to acne which is a disease of the sweat glands. This condition is slightly more common in African-Americans and women. SYMPTOMS   Hidradenitis usually starts as one or more red, tender, swellings in the groin or under the arms (axilla).   Over a period of hours to days the lesions get larger. They often open to the skin surface, draining clear to yellow-colored fluid.   The infected area heals with scarring.  DIAGNOSIS  Your caregiver makes this diagnosis by looking at you. Sometimes cultures (growing germs on plates in the lab) may be taken. This is to see what germ (bacterium) is causing the infection.  TREATMENT   Topical germ killing medicine applied to the skin (antibiotics) are the treatment of choice. Antibiotics taken by mouth (systemic) are sometimes needed when the condition is getting worse or is severe.   Avoid tight-fitting clothing which traps moisture in.   Dirt does not cause hidradenitis and it is not caused by poor hygiene.   Involved areas should be cleaned daily using an antibacterial soap. Some patients find that the liquid form of Lever 2000, applied to the involved areas as a lotion after bathing, can help reduce the odor related to this condition.   Sometimes surgery is needed to drain infected areas or remove scarred tissue.  Removal of large amounts of tissue is used only in severe cases.   Birth control pills may be helpful.   Oral retinoids (vitamin A derivatives) for 6 to 12 months which are effective for acne may also help this condition.   Weight loss will improve but not cure hidradenitis. It is made worse by being overweight. But the condition is not caused by being overweight.   This condition is more common in people who have had acne.   It may become worse under stress.  There is no medical cure for hidradenitis. It can be controlled, but not cured. The condition usually continues for years with periods of getting worse and getting better (remission). Document Released: 06/17/2004 Document Revised: 10/23/2011 Document Reviewed: 07/03/2008 [employer] Patient Information 2012 [employer]. 

## 2012-05-17 ENCOUNTER — Telehealth: Payer: Self-pay

## 2012-05-17 ENCOUNTER — Telehealth: Payer: Self-pay | Admitting: Internal Medicine

## 2012-05-17 NOTE — Telephone Encounter (Signed)
Pt has appt for surgical center for surgery, they are calling asking for last ov notes,chest xray, and labs, and if possible last sleep study report, it is an old study,please fax to [phone]

## 2012-05-17 NOTE — Telephone Encounter (Signed)
Faxed to Debbie at [facility] [phone] LOV and EKG. 05/17/12 emg

## 2012-05-18 NOTE — Telephone Encounter (Signed)
Recent office notes and labs faxed thru Epic. Chest x-ray and sleep study faxed with confirmation.

## 2012-05-19 DIAGNOSIS — K602 Anal fissure, unspecified: Secondary | ICD-10-CM

## 2012-05-26 ENCOUNTER — Telehealth: Payer: Self-pay

## 2012-05-26 NOTE — Telephone Encounter (Signed)
Requests refill auth for Doxycycline due to recent rectal surgery. Also has current RX for Cialis, but would like to switch to Viagra. States has taken Viagra before and it should be in his chart. Therefore would be new RX. Please call patient [phone] if questions.

## 2012-05-26 NOTE — Telephone Encounter (Signed)
Pull chart

## 2012-05-27 ENCOUNTER — Encounter: Payer: Self-pay | Admitting: Family Medicine

## 2012-05-27 MED ORDER — SILDENAFIL CITRATE 100 MG PO TABS: 50.0000 mg | ORAL_TABLET | Freq: Every day | ORAL | Status: DC | PRN

## 2012-05-27 NOTE — Telephone Encounter (Signed)
I'm a little confused about the Doxycycline refill. Please get more info on this. Rx for Viagra sent in.

## 2012-05-27 NOTE — Telephone Encounter (Signed)
Chart [id number] in phone message stack at [facility] desk

## 2012-05-28 ENCOUNTER — Ambulatory Visit (INDEPENDENT_AMBULATORY_CARE_PROVIDER_SITE_OTHER): Payer: BC Managed Care – PPO | Admitting: Surgery

## 2012-05-28 ENCOUNTER — Encounter (INDEPENDENT_AMBULATORY_CARE_PROVIDER_SITE_OTHER): Payer: Self-pay | Admitting: Surgery

## 2012-05-28 VITALS — BP 138/88 | HR 68 | Temp 98.3°F | Resp 14 | Ht 72.0 in | Wt 245.0 lb

## 2012-05-28 DIAGNOSIS — Z9889 Other specified postprocedural states: Secondary | ICD-10-CM | POA: Insufficient documentation

## 2012-05-28 MED ORDER — OXYCODONE-ACETAMINOPHEN 10-325 MG PO TABS: 1.0000 | ORAL_TABLET | ORAL | Status: DC | PRN

## 2012-05-28 NOTE — Telephone Encounter (Signed)
Pt reported that he has received Rxs for Doxy several times in past pre-surgery (last by Dr Merla Riches) and now that he has had surgery and is healing he feels that he should be on a round of Doxy to protect against infection and for the anti-inflammatory effects he has seen w/its use in the past. Requests that we ask Dr Merla Riches if he would Rx a round for him. Pt states that he is not having any s/s of infection at this time, but w/his hx is worried about it.

## 2012-05-28 NOTE — Progress Notes (Signed)
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 He is doing fairly well except for some drainage. His pain appears minimal.  Exam: Open wound left perianal region clean measuring 2 x 3 cm. No signs of infection. Impression  Impression: 2 weeks status post lateral internal sphincterotomy and debridement of chronic perianal abscess  Plan: Continue present care. Refill pain medicine. Return in 4 weeks.

## 2012-05-28 NOTE — Telephone Encounter (Signed)
If he gets infected in post surgical period for up to 6 weeks, he needs to be reexamined before taking antibiotics

## 2012-05-28 NOTE — Patient Instructions (Signed)
Continue present care.  Return 4 weeks

## 2012-05-30 ENCOUNTER — Telehealth: Payer: Self-pay | Admitting: Radiology

## 2012-05-30 NOTE — Telephone Encounter (Signed)
Patient said he was doing okay, with a little pain, small discharge. The wound will be open for 4 more weeks per his surgeon. He wanted more doxy for prophylaxis. He was given antibiotic cream by the surgeon.

## 2012-05-30 NOTE — Telephone Encounter (Signed)
The decision about his need for doxycycline for this belongs to the surgeon.  Advise the patient to contact the surgeon on Monday with this request.

## 2012-05-31 NOTE — Telephone Encounter (Signed)
Called pt and explained that he should call his surgeon''s office for this request. Pt agreed.

## 2012-07-05 ENCOUNTER — Other Ambulatory Visit: Payer: Self-pay | Admitting: Internal Medicine

## 2012-08-03 ENCOUNTER — Other Ambulatory Visit: Payer: Self-pay | Admitting: Internal Medicine

## 2012-10-04 ENCOUNTER — Other Ambulatory Visit: Payer: Self-pay | Admitting: Internal Medicine

## 2012-10-05 ENCOUNTER — Other Ambulatory Visit: Payer: Self-pay | Admitting: Internal Medicine

## 2012-12-02 ENCOUNTER — Other Ambulatory Visit: Payer: Self-pay | Admitting: Physician Assistant

## 2012-12-02 ENCOUNTER — Other Ambulatory Visit: Payer: Self-pay | Admitting: Internal Medicine

## 2012-12-02 NOTE — Telephone Encounter (Signed)
Needs OV /labs

## 2013-01-01 ENCOUNTER — Other Ambulatory Visit: Payer: Self-pay | Admitting: Physician Assistant

## 2013-01-04 ENCOUNTER — Other Ambulatory Visit: Payer: Self-pay | Admitting: *Deleted

## 2013-01-04 MED ORDER — LOSARTAN POTASSIUM-HCTZ 100-25 MG PO TABS: 1.0000 | ORAL_TABLET | Freq: Every day | ORAL | Status: DC

## 2013-01-04 MED ORDER — AMLODIPINE BESYLATE 10 MG PO TABS: 10.0000 mg | ORAL_TABLET | Freq: Every day | ORAL | Status: DC

## 2013-02-14 ENCOUNTER — Ambulatory Visit: Payer: BC Managed Care – PPO | Admitting: Emergency Medicine

## 2013-02-14 VITALS — BP 128/80 | HR 81 | Temp 98.4°F | Resp 17 | Ht 71.0 in | Wt 260.0 lb

## 2013-02-14 DIAGNOSIS — E782 Mixed hyperlipidemia: Secondary | ICD-10-CM

## 2013-02-14 DIAGNOSIS — I10 Essential (primary) hypertension: Secondary | ICD-10-CM

## 2013-02-14 LAB — TSH: TSH: 1.4 u[IU]/mL (ref 0.350–4.500)

## 2013-02-14 LAB — POCT CBC
HCT, POC: 45.1 % (ref 43.5–53.7)
Hemoglobin: 14.4 g/dL (ref 14.1–18.1)
Lymph, poc: 2.8 (ref 0.6–3.4)
MCH, POC: 32.3 pg — AB (ref 27–31.2)
MCV: 101.1 fL — AB (ref 80–97)
MPV: 7.5 fL (ref 0–99.8)
RBC: 4.46 M/uL — AB (ref 4.69–6.13)
WBC: 8.2 10*3/uL (ref 4.6–10.2)

## 2013-02-14 LAB — LIPID PANEL: HDL: 42 mg/dL (ref >39)

## 2013-02-14 LAB — POCT URINALYSIS DIPSTICK
Bilirubin, UA: NEGATIVE
Blood, UA: NEGATIVE
Glucose, UA: NEGATIVE
Ketones, UA: NEGATIVE
Nitrite, UA: NEGATIVE
Spec Grav, UA: 1.015

## 2013-02-14 LAB — COMPREHENSIVE METABOLIC PANEL
AST: 21 U/L (ref 0–37)
Albumin: 4.4 g/dL (ref 3.5–5.2)
BUN: 20 mg/dL (ref 6–23)
CO2: 28 mEq/L (ref 19–32)
Calcium: 9.5 mg/dL (ref 8.4–10.5)
Chloride: 101 mEq/L (ref 96–112)
Potassium: 3.9 mEq/L (ref 3.5–5.3)

## 2013-02-14 MED ORDER — LABETALOL HCL 200 MG PO TABS: 200.0000 mg | ORAL_TABLET | Freq: Two times a day (BID) | ORAL | Status: DC

## 2013-02-14 MED ORDER — AMLODIPINE BESYLATE 10 MG PO TABS: 10.0000 mg | ORAL_TABLET | Freq: Every day | ORAL | Status: DC

## 2013-02-14 MED ORDER — LOSARTAN POTASSIUM-HCTZ 100-25 MG PO TABS: 1.0000 | ORAL_TABLET | Freq: Every day | ORAL | Status: DC

## 2013-02-14 NOTE — Patient Instructions (Addendum)

## 2013-02-14 NOTE — Progress Notes (Signed)
[facility] [address], [place] [address] [phone]  Date:  02/14/2013   Name:  [name] [name]   DOB:  16-Dec-1950   MRN:  [id number]  PCP:  JEFFERY,CHELLE, PA-C    Chief Complaint: Medication Refill   History of Present Illness:  [name] [name] is a 62 y.o. very pleasant male patient who presents with the following:  For semiannual blood draw.  Fasting.  Tolerating medication well.  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  This is a stable finding. No improvement with over the counter medications or other home remedies. Denies other complaint or health concern today.   Patient Active Problem List  Diagnosis  . HYPERLIPIDEMIA-MIXED  . HYPERTENSION, BENIGN  . Depression, recurrent  . Sleep apnea  . BMI 33.0-33.9,adult  . ED (erectile dysfunction)  . Hidradenitis suppurativa of anus  . Post-operative state    Past Medical History  Diagnosis Date  . Arthritis   . Hypertension   . Rectal cyst   . Allergy   . Depression     Past Surgical History  Procedure Laterality Date  . Hernia repair      umb hernia at birth    History  Substance Use Topics  . Smoking status: Former Games developer  . Smokeless tobacco: Former Neurosurgeon    Quit date: 04/16/2010  . Alcohol Use: No    Family History  Problem Relation Age of Onset  . Hypertension Mother   . Cancer Father     bladder  . Cancer Sister     breast    Allergies  Allergen Reactions  . Sulfamethoxazole W-Trimethoprim     Medication list has been reviewed and updated.  Current Outpatient Prescriptions on File Prior to Visit  Medication Sig Dispense Refill  . amLODipine (NORVASC) 10 MG tablet Take 1 tablet (10 mg total) by mouth daily.  30 tablet  5  . doxycycline (VIBRAMYCIN) 100 MG capsule       . escitalopram (LEXAPRO) 20 MG tablet Take 1 tablet (20 mg total) by mouth daily.  30 tablet  5  . fish oil-omega-3 fatty acids  1000 MG capsule Take 2 g by mouth daily.      Marland Kitchen labetalol (NORMODYNE) 200 MG tablet Take 1 tablet (200 mg total) by mouth 2 (two) times daily.  60 tablet  5  . labetalol (NORMODYNE) 200 MG tablet TAKE 1 TABLET BY MOUTH TWICE DAILY  180 tablet  0  . losartan-hydrochlorothiazide (HYZAAR) 100-25 MG per tablet Take 1 tablet by mouth daily.  30 tablet  5  . Naproxen Sodium (ALEVE PO) Take by mouth daily.      . NON FORMULARY cucurmin 500 mg BID      . sildenafil (VIAGRA) 100 MG tablet Take 0.5-1 tablets (50-100 mg total) by mouth daily as needed for erectile dysfunction.  5 tablet  11   No current facility-administered medications on file prior to visit.    Review of Systems:  As per HPI, otherwise negative.    Physical Examination: Filed Vitals:   02/14/13 0922  BP: 128/80  Pulse: 81  Temp: 98.4 F (36.9 C)  Resp: 17   Filed Vitals:   02/14/13 0922  Height: 5\' 11"  (1.803 m)  Weight: 260 lb (117.935 kg)   Body mass index is 36.28 kg/(m^2). Ideal Body Weight: Weight in (lb) to have BMI = 25: 178.9  GEN: WDWN, NAD, Non-toxic,  A & O x 3 HEENT: Atraumatic, Normocephalic. Neck supple. No masses, No LAD. Ears and Nose: No external deformity. CV: RRR, No M/G/R. No JVD. No thrill. No extra heart sounds. PULM: CTA B, no wheezes, crackles, rhonchi. No retractions. No resp. distress. No accessory muscle use. ABD: S, NT, ND, +BS. No rebound. No HSM. EXTR: No c/c/e NEURO Normal gait.  PSYCH: Normally interactive. Conversant. Not depressed or anxious appearing.  Calm demeanor.    Assessment and Plan: Hyperlipidemia Hypertension Obesity Sexual dysfunction Depression Labs   Signed,  Phillips Odor, MD

## 2013-02-17 ENCOUNTER — Encounter: Payer: Self-pay | Admitting: Family Medicine

## 2013-06-10 ENCOUNTER — Other Ambulatory Visit: Payer: Self-pay | Admitting: Radiology

## 2013-06-10 DIAGNOSIS — F329 Major depressive disorder, single episode, unspecified: Secondary | ICD-10-CM

## 2013-06-10 DIAGNOSIS — I10 Essential (primary) hypertension: Secondary | ICD-10-CM

## 2013-06-10 NOTE — Telephone Encounter (Signed)
Pended meds requested from [facility]. Please advise.

## 2013-06-13 MED ORDER — LABETALOL HCL 200 MG PO TABS
200.0000 mg | ORAL_TABLET | Freq: Two times a day (BID) | ORAL | Status: DC
Start: 2013-06-10 — End: 2013-08-29

## 2013-06-13 MED ORDER — SILDENAFIL CITRATE 100 MG PO TABS: 50.0000 mg | ORAL_TABLET | Freq: Every day | ORAL | Status: AC | PRN

## 2013-06-13 MED ORDER — AMLODIPINE BESYLATE 10 MG PO TABS: 10.0000 mg | ORAL_TABLET | Freq: Every day | ORAL | Status: DC

## 2013-06-13 MED ORDER — ESCITALOPRAM OXALATE 20 MG PO TABS: 20.0000 mg | ORAL_TABLET | Freq: Every day | ORAL | Status: DC

## 2013-06-13 MED ORDER — LOSARTAN POTASSIUM-HCTZ 100-25 MG PO TABS: 1.0000 | ORAL_TABLET | Freq: Every day | ORAL | Status: DC

## 2013-07-31 ENCOUNTER — Emergency Department (HOSPITAL_COMMUNITY): Payer: BC Managed Care – PPO

## 2013-07-31 ENCOUNTER — Encounter (HOSPITAL_COMMUNITY): Payer: Self-pay | Admitting: Cardiology

## 2013-07-31 ENCOUNTER — Observation Stay (HOSPITAL_COMMUNITY)
Admission: EM | Admit: 2013-07-31 | Discharge: 2013-08-02 | Disposition: A | Discharge status: Home / Self Care | Payer: BC Managed Care – PPO | Attending: Family Medicine | Admitting: Family Medicine

## 2013-07-31 DIAGNOSIS — I10 Essential (primary) hypertension: Secondary | ICD-10-CM | POA: Insufficient documentation

## 2013-07-31 DIAGNOSIS — R55 Syncope and collapse: Principal | ICD-10-CM | POA: Insufficient documentation

## 2013-07-31 DIAGNOSIS — S2239XA Fracture of one rib, unspecified side, initial encounter for closed fracture: Secondary | ICD-10-CM | POA: Insufficient documentation

## 2013-07-31 DIAGNOSIS — S2232XA Fracture of one rib, left side, initial encounter for closed fracture: Secondary | ICD-10-CM

## 2013-07-31 DIAGNOSIS — R911 Solitary pulmonary nodule: Secondary | ICD-10-CM | POA: Insufficient documentation

## 2013-07-31 DIAGNOSIS — Z79899 Other long term (current) drug therapy: Secondary | ICD-10-CM | POA: Insufficient documentation

## 2013-07-31 DIAGNOSIS — F339 Major depressive disorder, recurrent, unspecified: Secondary | ICD-10-CM

## 2013-07-31 LAB — TROPONIN I
Troponin I: <0.3 ng/mL (ref <0.30)
Troponin I: <0.3 ng/mL (ref <0.30)

## 2013-07-31 LAB — COMPREHENSIVE METABOLIC PANEL
Albumin: 4.1 g/dL (ref 3.5–5.2)
BUN: 20 mg/dL (ref 6–23)
Creatinine, Ser: 0.76 mg/dL (ref 0.50–1.35)
Potassium: 3.5 mEq/L (ref 3.5–5.1)
Total Protein: 7.3 g/dL (ref 6.0–8.3)

## 2013-07-31 LAB — CBC
HCT: 42.9 % (ref 39.0–52.0)
MCHC: 33.8 g/dL (ref 30.0–36.0)
MCV: 99.3 fL (ref 78.0–100.0)
RDW: 14.1 % (ref 11.5–15.5)

## 2013-07-31 LAB — ETHANOL: Alcohol, Ethyl (B): <11 mg/dL (ref 0–11)

## 2013-07-31 LAB — RAPID URINE DRUG SCREEN, HOSP PERFORMED
Benzodiazepines: NOT DETECTED
Cocaine: NOT DETECTED

## 2013-07-31 MED ORDER — SODIUM CHLORIDE 0.9 % IJ SOLN: 3.0000 mL | Freq: Two times a day (BID) | INTRAMUSCULAR | Status: DC

## 2013-07-31 MED ORDER — HEPARIN SODIUM (PORCINE) 5000 UNIT/ML IJ SOLN
5000.0000 [IU] | Freq: Three times a day (TID) | INTRAMUSCULAR | Status: DC
  Administered 2013-07-31 – 2013-08-02 (×4): 5000 [IU] via SUBCUTANEOUS
  Filled 2013-07-31 (×8): qty 1

## 2013-07-31 MED ORDER — KETOROLAC TROMETHAMINE 30 MG/ML IJ SOLN
30.0000 mg | Freq: Four times a day (QID) | INTRAMUSCULAR | Status: DC
  Administered 2013-07-31 – 2013-08-02 (×6): 30 mg via INTRAVENOUS
  Filled 2013-07-31 (×10): qty 1

## 2013-07-31 MED ORDER — SODIUM CHLORIDE 0.9 % IJ SOLN: 3.0000 mL | INTRAMUSCULAR | Status: DC | PRN

## 2013-07-31 MED ORDER — LOSARTAN POTASSIUM-HCTZ 100-25 MG PO TABS: 1.0000 | ORAL_TABLET | Freq: Every day | ORAL | Status: DC

## 2013-07-31 MED ORDER — HYDROCHLOROTHIAZIDE 25 MG PO TABS
25.0000 mg | ORAL_TABLET | Freq: Every day | ORAL | Status: DC
  Administered 2013-08-01 – 2013-08-02 (×2): 25 mg via ORAL
  Filled 2013-07-31 (×2): qty 1

## 2013-07-31 MED ORDER — ESCITALOPRAM OXALATE 5 MG PO TABS
5.0000 mg | ORAL_TABLET | Freq: Every day | ORAL | Status: DC
  Administered 2013-08-01: 5 mg via ORAL
  Filled 2013-07-31 (×3): qty 1

## 2013-07-31 MED ORDER — ACETAMINOPHEN 650 MG RE SUPP: 650.0000 mg | Freq: Four times a day (QID) | RECTAL | Status: DC | PRN

## 2013-07-31 MED ORDER — MORPHINE SULFATE 4 MG/ML IJ SOLN
4.0000 mg | Freq: Once | INTRAMUSCULAR | Status: AC
  Administered 2013-07-31: 4 mg via INTRAVENOUS
  Filled 2013-07-31: qty 1

## 2013-07-31 MED ORDER — ACETAMINOPHEN 325 MG PO TABS: 650.0000 mg | ORAL_TABLET | Freq: Four times a day (QID) | ORAL | Status: DC | PRN

## 2013-07-31 MED ORDER — OXYCODONE-ACETAMINOPHEN 5-325 MG PO TABS
1.0000 | ORAL_TABLET | Freq: Four times a day (QID) | ORAL | Status: DC | PRN
  Administered 2013-07-31: 1 via ORAL
  Filled 2013-07-31: qty 1

## 2013-07-31 MED ORDER — AMLODIPINE BESYLATE 10 MG PO TABS
10.0000 mg | ORAL_TABLET | Freq: Every day | ORAL | Status: DC
  Administered 2013-08-01 – 2013-08-02 (×2): 10 mg via ORAL
  Filled 2013-07-31 (×2): qty 1

## 2013-07-31 MED ORDER — SODIUM CHLORIDE 0.9 % IJ SOLN
3.0000 mL | Freq: Two times a day (BID) | INTRAMUSCULAR | Status: DC
  Administered 2013-07-31 – 2013-08-01 (×2): 3 mL via INTRAVENOUS

## 2013-07-31 MED ORDER — IOHEXOL 300 MG/ML  SOLN
100.0000 mL | Freq: Once | INTRAMUSCULAR | Status: AC | PRN
  Administered 2013-07-31: 100 mL via INTRAVENOUS

## 2013-07-31 MED ORDER — LOSARTAN POTASSIUM 50 MG PO TABS
100.0000 mg | ORAL_TABLET | Freq: Every day | ORAL | Status: DC
  Administered 2013-08-01 – 2013-08-02 (×2): 100 mg via ORAL
  Filled 2013-07-31 (×2): qty 2

## 2013-07-31 MED ORDER — DOXYCYCLINE HYCLATE 50 MG PO CAPS
50.0000 mg | ORAL_CAPSULE | Freq: Every day | ORAL | Status: DC
  Filled 2013-07-31: qty 1

## 2013-07-31 MED ORDER — KETOROLAC TROMETHAMINE 30 MG/ML IJ SOLN: 30.0000 mg | Freq: Four times a day (QID) | INTRAMUSCULAR | Status: DC | PRN

## 2013-07-31 MED ORDER — SENNOSIDES-DOCUSATE SODIUM 8.6-50 MG PO TABS
1.0000 | ORAL_TABLET | Freq: Every evening | ORAL | Status: DC | PRN
  Filled 2013-07-31: qty 1

## 2013-07-31 MED ORDER — LABETALOL HCL 200 MG PO TABS
200.0000 mg | ORAL_TABLET | Freq: Two times a day (BID) | ORAL | Status: DC
  Administered 2013-07-31 – 2013-08-02 (×4): 200 mg via ORAL
  Filled 2013-07-31 (×5): qty 1

## 2013-07-31 MED ORDER — SODIUM CHLORIDE 0.9 % IV SOLN: 250.0000 mL | INTRAVENOUS | Status: DC | PRN

## 2013-07-31 MED ORDER — ONDANSETRON HCL 4 MG/2ML IJ SOLN: 4.0000 mg | Freq: Four times a day (QID) | INTRAMUSCULAR | Status: DC | PRN

## 2013-07-31 MED ORDER — ONDANSETRON HCL 4 MG PO TABS: 4.0000 mg | ORAL_TABLET | Freq: Four times a day (QID) | ORAL | Status: DC | PRN

## 2013-07-31 MED ORDER — DOXYCYCLINE MONOHYDRATE 50 MG PO TABS: 50.0000 mg | ORAL_TABLET | Freq: Every day | ORAL | Status: DC

## 2013-07-31 NOTE — ED Notes (Signed)
C-collar removed per MD order

## 2013-07-31 NOTE — ED Notes (Addendum)
Pt given IS and teach back done. Encouraged to use about every hour.

## 2013-07-31 NOTE — ED Notes (Signed)
Admitting MD at the bedside.  

## 2013-07-31 NOTE — Progress Notes (Signed)
RT Note: Patient has brought in his home CPAP machine. RT checked out machine and looked over cords. Machine seems to be in good shape. RT also put more sterile water into the humidification chamber. Patient says he has been using the machine about 15 years and knows how to operate it as needed. Patient is aware to call RT if he does need help putting the machine on or any other assistance. RT will continue to assist as needed.  Ellen Henri RRT RCP
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
## 2013-08-01 DIAGNOSIS — R55 Syncope and collapse: Secondary | ICD-10-CM

## 2013-08-01 DIAGNOSIS — F339 Major depressive disorder, recurrent, unspecified: Secondary | ICD-10-CM

## 2013-08-01 DIAGNOSIS — I517 Cardiomegaly: Secondary | ICD-10-CM

## 2013-08-01 DIAGNOSIS — S2239XA Fracture of one rib, unspecified side, initial encounter for closed fracture: Secondary | ICD-10-CM

## 2013-08-01 LAB — CBC
Hemoglobin: 13.7 g/dL (ref 13.0–17.0)
MCHC: 33.7 g/dL (ref 30.0–36.0)
RBC: 4.1 MIL/uL — ABNORMAL LOW (ref 4.22–5.81)
WBC: 10.7 10*3/uL — ABNORMAL HIGH (ref 4.0–10.5)

## 2013-08-01 LAB — BASIC METABOLIC PANEL
BUN: 21 mg/dL (ref 6–23)
Chloride: 100 mEq/L (ref 96–112)
GFR calc Af Amer: >90 mL/min (ref >90)
GFR calc non Af Amer: >90 mL/min (ref >90)
Potassium: 3.6 mEq/L (ref 3.5–5.1)
Sodium: 139 mEq/L (ref 135–145)

## 2013-08-01 LAB — TROPONIN I: Troponin I: <0.3 ng/mL (ref <0.30)

## 2013-08-01 MED ORDER — OXYCODONE-ACETAMINOPHEN 5-325 MG PO TABS
1.0000 | ORAL_TABLET | Freq: Four times a day (QID) | ORAL | Status: DC | PRN
  Administered 2013-08-01 (×2): 2 via ORAL
  Administered 2013-08-01 (×2): 1 via ORAL
  Administered 2013-08-02 (×2): 2 via ORAL
  Filled 2013-08-01 (×3): qty 1
  Filled 2013-08-01: qty 2
  Filled 2013-08-01: qty 1
  Filled 2013-08-01 (×3): qty 2

## 2013-08-01 MED ORDER — DOXYCYCLINE HYCLATE 100 MG PO TABS
50.0000 mg | ORAL_TABLET | Freq: Every day | ORAL | Status: DC
  Administered 2013-08-01 – 2013-08-02 (×2): 50 mg via ORAL
  Filled 2013-08-01 (×2): qty 0.5

## 2013-08-01 MED ORDER — OXYCODONE-ACETAMINOPHEN 5-325 MG PO TABS: 1.0000 | ORAL_TABLET | Freq: Four times a day (QID) | ORAL | Status: DC | PRN

## 2013-08-01 NOTE — Progress Notes (Signed)
VASCULAR LAB PRELIMINARY  PRELIMINARY  PRELIMINARY  PRELIMINARY  Carotid Dopplers completed.    Preliminary report:  1-39% ICA stenosis.  Vertebral artery flow is antegrade.  Terius Jacuinde, RVT 08/01/2013, 1:54 PM
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
## 2013-08-01 NOTE — Progress Notes (Signed)
  Echocardiogram 2D Echocardiogram has been performed.  Kerwin Augustus 08/01/2013, 3:00 PM
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
## 2013-08-01 NOTE — Progress Notes (Signed)
Pt will place on himself when ready. No distress noted. Pt encouraged to call RT if needed
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
## 2013-08-02 DIAGNOSIS — R55 Syncope and collapse: Secondary | ICD-10-CM

## 2013-08-02 DIAGNOSIS — I10 Essential (primary) hypertension: Secondary | ICD-10-CM

## 2013-08-02 MED ORDER — LIDOCAINE 5 % EX PTCH: 1.0000 | MEDICATED_PATCH | CUTANEOUS | Status: DC

## 2013-08-02 MED ORDER — MELOXICAM 15 MG PO TABS: 15.0000 mg | ORAL_TABLET | Freq: Every day | ORAL | Status: DC

## 2013-08-02 MED ORDER — OXYCODONE-ACETAMINOPHEN 5-325 MG PO TABS: 1.0000 | ORAL_TABLET | Freq: Four times a day (QID) | ORAL | Status: DC | PRN

## 2013-08-02 MED ORDER — ASPIRIN EC 81 MG PO TBEC: 81.0000 mg | DELAYED_RELEASE_TABLET | Freq: Every day | ORAL | Status: DC
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
## 2013-08-02 NOTE — Progress Notes (Signed)
Pts assessment unchanged from this am. D/c'd to private vehicle via wheelchair.  
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
## 2013-08-22 ENCOUNTER — Ambulatory Visit: Payer: BC Managed Care – PPO | Admitting: Family Medicine

## 2013-08-22 VITALS — BP 134/84 | HR 65 | Temp 98.6°F | Resp 18 | Ht 71.5 in | Wt 261.0 lb

## 2013-08-22 DIAGNOSIS — IMO0002 Reserved for concepts with insufficient information to code with codable children: Secondary | ICD-10-CM

## 2013-08-22 DIAGNOSIS — S2232XS Fracture of one rib, left side, sequela: Secondary | ICD-10-CM

## 2013-08-22 DIAGNOSIS — M1710 Unilateral primary osteoarthritis, unspecified knee: Secondary | ICD-10-CM

## 2013-08-22 MED ORDER — HYDROCODONE-ACETAMINOPHEN 5-325 MG PO TABS: 1.0000 | ORAL_TABLET | Freq: Four times a day (QID) | ORAL | Status: DC | PRN

## 2013-08-22 MED ORDER — MELOXICAM 7.5 MG PO TABS: 7.5000 mg | ORAL_TABLET | Freq: Two times a day (BID) | ORAL | Status: DC
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
## 2013-08-22 NOTE — Patient Instructions (Signed)
Use the pain medication with caution. Your wife must company he used to pick up the medication.  You can take acetaminophen maximum 3000 mg daily   Take the meloxicam 7.5 mg twice daily with food, but discontinued causing any stomach problems. When possible, especially after your trip, decreased to one daily  Return if problems  Return in 6 months to get a followup CT of chest regarding pulmonary nodule

## 2013-08-29 ENCOUNTER — Telehealth: Payer: Self-pay | Admitting: Radiology

## 2013-08-29 DIAGNOSIS — I10 Essential (primary) hypertension: Secondary | ICD-10-CM

## 2013-08-29 MED ORDER — LABETALOL HCL 200 MG PO TABS: 200.0000 mg | ORAL_TABLET | Freq: Two times a day (BID) | ORAL | Status: AC

## 2013-08-29 MED ORDER — LOSARTAN POTASSIUM-HCTZ 100-25 MG PO TABS: 1.0000 | ORAL_TABLET | Freq: Every day | ORAL | Status: DC

## 2013-08-29 MED ORDER — AMLODIPINE BESYLATE 10 MG PO TABS: 10.0000 mg | ORAL_TABLET | Freq: Every day | ORAL | Status: DC

## 2013-08-29 NOTE — Telephone Encounter (Signed)
Pt requested RFs on percocet and meloxicam at last OV, no mention of other meds needing refills.  In any case,  I will send labetalol, hyzaar, and amlodipine.  Meloxicam was sent on 08/22/13 - #60 with 1 RF.  Sent to [facility] at [facility] and [place]

## 2013-08-29 NOTE — Telephone Encounter (Signed)
I was at [facility] for 4 hours last week, and requested that my meds (labetalol, Hyzaar, and amlodipine, and meloxicam) be renewed. They were not. Your records will show that I just had $19,000 worth of diagnostics at [facility], and I fully trust that will suffice for the regular blood draw. I am out of town until Thursday, and leave then immediately for [place]. Therefore, I request you have those three meds renewed at [facility] at [place] and [place].   On his visit from Oct 6, notes state he needed RF's on meloxicam and percocet. Patient was prescribed Norco and Meloxicam on day of visit. He is requesting for the other meds listed above. He did have recent bloodwork performed at the hospital in Sept. Will this suffice to give him rx RF's? Or will he need to RTC? Either way patient needs to know before leaving out for [place]. Please route this message back to French Ana so she can follow up with the patient.  Thanks, French Ana

## 2013-08-29 NOTE — Telephone Encounter (Signed)
Sent patient via e-mail notifying him of rx's sent in. Pharmacy confirmed pt picked up meloxicam and norco on Oct 6th.

## 2013-09-12 ENCOUNTER — Telehealth: Payer: Self-pay

## 2013-09-12 DIAGNOSIS — F1910 Other psychoactive substance abuse, uncomplicated: Secondary | ICD-10-CM

## 2013-09-12 NOTE — Telephone Encounter (Signed)
Patients wife is calling wanting a nurse or dr to call her regarding her husbands behavior please call myline at [phone]
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
## 2013-09-13 NOTE — Telephone Encounter (Signed)
I have not seen this patient in quite some time (at least since 12/16/2011 when we had our go-live with [facility]).  Advice provided was appropriate.  I am happy to see him, if he'll agree.  If his wife feels unsafe, she should call 911, or move out (or both).  Loving an alcoholic is very difficult.  She is encouraged to seek care (medical &/or counseling) herself, as well.

## 2013-09-13 NOTE — Telephone Encounter (Signed)
Patient wife is returning Amy's call.  [phone]

## 2013-09-13 NOTE — Telephone Encounter (Signed)
Called left message for her to call me back.  

## 2013-09-15 DIAGNOSIS — F1910 Other psychoactive substance abuse, uncomplicated: Secondary | ICD-10-CM | POA: Insufficient documentation

## 2013-09-15 NOTE — Telephone Encounter (Signed)
Thanks, the wife wanted you to advise, since they have seen you and felt comfortable with any advise you may have been able to offer. I have called to advise, since did not offer this at the time of our last conversation. She indicates he will not get help with this, with you or anyone else. Did advise her of behavioral health. She advised she may try [facility] [place]. She may also try to get him to come in to see you. She has asked [place] to please not provide him any more prescriptions for narcotics as well.

## 2013-09-15 NOTE — Telephone Encounter (Signed)
Notation made in the chart indicating substance abuse.

## 2013-12-10 ENCOUNTER — Emergency Department (HOSPITAL_COMMUNITY): Payer: BC Managed Care – PPO

## 2013-12-10 ENCOUNTER — Encounter (HOSPITAL_COMMUNITY): Payer: Self-pay | Admitting: Emergency Medicine

## 2013-12-10 ENCOUNTER — Emergency Department (HOSPITAL_COMMUNITY): Payer: BC Managed Care – PPO | Admitting: Anesthesiology

## 2013-12-10 ENCOUNTER — Encounter (HOSPITAL_COMMUNITY): Payer: BC Managed Care – PPO | Admitting: Anesthesiology

## 2013-12-10 ENCOUNTER — Observation Stay (HOSPITAL_COMMUNITY)
Admission: EM | Admit: 2013-12-10 | Discharge: 2013-12-11 | Disposition: A | Discharge status: Home / Self Care | Payer: BC Managed Care – PPO | Attending: Orthopedic Surgery | Admitting: Orthopedic Surgery

## 2013-12-10 ENCOUNTER — Encounter (HOSPITAL_COMMUNITY)
Admission: EM | Disposition: A | Discharge status: Home / Self Care | Payer: Self-pay | Source: Home / Self Care | Attending: Emergency Medicine

## 2013-12-10 DIAGNOSIS — G5632 Lesion of radial nerve, left upper limb: Secondary | ICD-10-CM

## 2013-12-10 DIAGNOSIS — G5630 Lesion of radial nerve, unspecified upper limb: Secondary | ICD-10-CM | POA: Insufficient documentation

## 2013-12-10 DIAGNOSIS — F329 Major depressive disorder, single episode, unspecified: Secondary | ICD-10-CM | POA: Insufficient documentation

## 2013-12-10 DIAGNOSIS — S42302A Unspecified fracture of shaft of humerus, left arm, initial encounter for closed fracture: Secondary | ICD-10-CM | POA: Diagnosis present

## 2013-12-10 DIAGNOSIS — G4730 Sleep apnea, unspecified: Secondary | ICD-10-CM | POA: Insufficient documentation

## 2013-12-10 DIAGNOSIS — I10 Essential (primary) hypertension: Secondary | ICD-10-CM | POA: Insufficient documentation

## 2013-12-10 DIAGNOSIS — Z87891 Personal history of nicotine dependence: Secondary | ICD-10-CM | POA: Insufficient documentation

## 2013-12-10 DIAGNOSIS — S42309A Unspecified fracture of shaft of humerus, unspecified arm, initial encounter for closed fracture: Principal | ICD-10-CM | POA: Insufficient documentation

## 2013-12-10 DIAGNOSIS — F3289 Other specified depressive episodes: Secondary | ICD-10-CM | POA: Insufficient documentation

## 2013-12-10 HISTORY — PX: ORIF HUMERUS FRACTURE: SHX2126

## 2013-12-10 LAB — COMPREHENSIVE METABOLIC PANEL
ALK PHOS: 64 U/L (ref 39–117)
ALT: 32 U/L (ref 0–53)
AST: 31 U/L (ref 0–37)
Albumin: 4 g/dL (ref 3.5–5.2)
BUN: 20 mg/dL (ref 6–23)
CALCIUM: 9.1 mg/dL (ref 8.4–10.5)
CHLORIDE: 102 meq/L (ref 96–112)
CO2: 25 meq/L (ref 19–32)
Creatinine, Ser: 0.82 mg/dL (ref 0.50–1.35)
GFR calc Af Amer: >90 mL/min (ref >90)
Glucose, Bld: 101 mg/dL — ABNORMAL HIGH (ref 70–99)
POTASSIUM: 4 meq/L (ref 3.7–5.3)
SODIUM: 142 meq/L (ref 137–147)
Total Bilirubin: 0.2 mg/dL — ABNORMAL LOW (ref 0.3–1.2)
Total Protein: 7.2 g/dL (ref 6.0–8.3)

## 2013-12-10 LAB — URINALYSIS W MICROSCOPIC + REFLEX CULTURE
Bilirubin Urine: NEGATIVE
Glucose, UA: NEGATIVE mg/dL
Hgb urine dipstick: NEGATIVE
Ketones, ur: NEGATIVE mg/dL
Leukocytes, UA: NEGATIVE
Nitrite: NEGATIVE
Protein, ur: NEGATIVE mg/dL
Specific Gravity, Urine: 1.017 (ref 1.005–1.030)
Urine-Other: NONE SEEN
Urobilinogen, UA: 0.2 mg/dL (ref 0.0–1.0)
pH: 7 (ref 5.0–8.0)

## 2013-12-10 LAB — CBC WITH DIFFERENTIAL/PLATELET
Basophils Absolute: 0 K/uL (ref 0.0–0.1)
Basophils Relative: 0 % (ref 0–1)
Eosinophils Absolute: 0.2 K/uL (ref 0.0–0.7)
Eosinophils Relative: 2 % (ref 0–5)
HCT: 38.7 % — ABNORMAL LOW (ref 39.0–52.0)
Hemoglobin: 13.8 g/dL (ref 13.0–17.0)
Lymphocytes Relative: 18 % (ref 12–46)
Lymphs Abs: 2.4 K/uL (ref 0.7–4.0)
MCH: 33.7 pg (ref 26.0–34.0)
MCHC: 35.7 g/dL (ref 30.0–36.0)
MCV: 94.6 fL (ref 78.0–100.0)
Monocytes Absolute: 0.8 K/uL (ref 0.1–1.0)
Monocytes Relative: 6 % (ref 3–12)
Neutro Abs: 9.8 K/uL — ABNORMAL HIGH (ref 1.7–7.7)
Neutrophils Relative %: 74 % (ref 43–77)
Platelets: 249 K/uL (ref 150–400)
RBC: 4.09 MIL/uL — ABNORMAL LOW (ref 4.22–5.81)
RDW: 13.5 % (ref 11.5–15.5)
WBC: 13.2 K/uL — ABNORMAL HIGH (ref 4.0–10.5)

## 2013-12-10 LAB — ETHANOL: Alcohol, Ethyl (B): <11 mg/dL (ref 0–11)

## 2013-12-10 SURGERY — OPEN REDUCTION INTERNAL FIXATION (ORIF) HUMERAL SHAFT FRACTURE
Anesthesia: General | Site: Arm Upper | Laterality: Left

## 2013-12-10 MED ORDER — MIDAZOLAM HCL 2 MG/2ML IJ SOLN
INTRAMUSCULAR | Status: AC
  Filled 2013-12-10: qty 2

## 2013-12-10 MED ORDER — DEXTROSE 5 % IV SOLN
3.0000 g | INTRAVENOUS | Status: DC | PRN
  Administered 2013-12-10: 3 g via INTRAVENOUS

## 2013-12-10 MED ORDER — LIDOCAINE HCL (CARDIAC) 20 MG/ML IV SOLN
INTRAVENOUS | Status: AC
  Filled 2013-12-10: qty 5

## 2013-12-10 MED ORDER — FENTANYL CITRATE 0.05 MG/ML IJ SOLN
INTRAMUSCULAR | Status: AC
  Filled 2013-12-10: qty 5

## 2013-12-10 MED ORDER — HYDROMORPHONE HCL PF 1 MG/ML IJ SOLN
1.0000 mg | INTRAMUSCULAR | Status: DC | PRN
  Administered 2013-12-10 (×4): 1 mg via INTRAVENOUS
  Filled 2013-12-10 (×4): qty 1

## 2013-12-10 MED ORDER — STERILE WATER FOR INJECTION IJ SOLN
INTRAMUSCULAR | Status: AC
  Filled 2013-12-10: qty 10

## 2013-12-10 MED ORDER — SUCCINYLCHOLINE CHLORIDE 20 MG/ML IJ SOLN
INTRAMUSCULAR | Status: DC | PRN
  Administered 2013-12-10: 180 mg via INTRAVENOUS

## 2013-12-10 MED ORDER — SODIUM CHLORIDE 0.9 % IV BOLUS (SEPSIS)
500.0000 mL | INTRAVENOUS | Status: AC
  Administered 2013-12-10: 500 mL via INTRAVENOUS

## 2013-12-10 MED ORDER — GLYCOPYRROLATE 0.2 MG/ML IJ SOLN
INTRAMUSCULAR | Status: AC
  Filled 2013-12-10: qty 4

## 2013-12-10 MED ORDER — PROPOFOL 10 MG/ML IV BOLUS
INTRAVENOUS | Status: AC
  Filled 2013-12-10: qty 20

## 2013-12-10 MED ORDER — HYDROMORPHONE HCL PF 1 MG/ML IJ SOLN
1.0000 mg | Freq: Once | INTRAMUSCULAR | Status: AC
  Administered 2013-12-10: 1 mg via INTRAVENOUS
  Filled 2013-12-10: qty 1

## 2013-12-10 MED ORDER — LIDOCAINE HCL (CARDIAC) 20 MG/ML IV SOLN
INTRAVENOUS | Status: DC | PRN
  Administered 2013-12-10: 100 mg via INTRAVENOUS

## 2013-12-10 MED ORDER — ONDANSETRON HCL 4 MG/2ML IJ SOLN
INTRAMUSCULAR | Status: AC
  Filled 2013-12-10: qty 2

## 2013-12-10 MED ORDER — PHENYLEPHRINE 40 MCG/ML (10ML) SYRINGE FOR IV PUSH (FOR BLOOD PRESSURE SUPPORT)
PREFILLED_SYRINGE | INTRAVENOUS | Status: AC
  Filled 2013-12-10: qty 10

## 2013-12-10 MED ORDER — 0.9 % SODIUM CHLORIDE (POUR BTL) OPTIME
TOPICAL | Status: DC | PRN
  Administered 2013-12-10: 1000 mL

## 2013-12-10 MED ORDER — HYDROMORPHONE HCL PF 1 MG/ML IJ SOLN
0.5000 mg | INTRAMUSCULAR | Status: AC
  Administered 2013-12-10: 0.5 mg via INTRAVENOUS
  Filled 2013-12-10: qty 1

## 2013-12-10 MED ORDER — FENTANYL CITRATE 0.05 MG/ML IJ SOLN
50.0000 ug | Freq: Once | INTRAMUSCULAR | Status: AC
  Administered 2013-12-10: 50 ug via INTRAVENOUS
  Filled 2013-12-10: qty 2

## 2013-12-10 MED ORDER — VECURONIUM BROMIDE 10 MG IV SOLR
INTRAVENOUS | Status: AC
  Filled 2013-12-10: qty 10

## 2013-12-10 MED ORDER — EPHEDRINE SULFATE 50 MG/ML IJ SOLN
INTRAMUSCULAR | Status: DC | PRN
  Administered 2013-12-10: 10 mg via INTRAVENOUS
  Administered 2013-12-10 – 2013-12-11 (×3): 5 mg via INTRAVENOUS

## 2013-12-10 MED ORDER — SUCCINYLCHOLINE CHLORIDE 20 MG/ML IJ SOLN
INTRAMUSCULAR | Status: AC
  Filled 2013-12-10: qty 1

## 2013-12-10 MED ORDER — NEOSTIGMINE METHYLSULFATE 1 MG/ML IJ SOLN
INTRAMUSCULAR | Status: AC
  Filled 2013-12-10: qty 10

## 2013-12-10 MED ORDER — FENTANYL CITRATE 0.05 MG/ML IJ SOLN
INTRAMUSCULAR | Status: DC | PRN
  Administered 2013-12-10: 50 ug via INTRAVENOUS
  Administered 2013-12-10: 100 ug via INTRAVENOUS
  Administered 2013-12-11: 25 ug via INTRAVENOUS
  Administered 2013-12-11: 50 ug via INTRAVENOUS
  Administered 2013-12-11: 25 ug via INTRAVENOUS

## 2013-12-10 MED ORDER — CEFAZOLIN SODIUM 1-5 GM-% IV SOLN
INTRAVENOUS | Status: AC
  Filled 2013-12-10: qty 50

## 2013-12-10 MED ORDER — LACTATED RINGERS IV SOLN
INTRAVENOUS | Status: DC | PRN
  Administered 2013-12-10 (×2): via INTRAVENOUS

## 2013-12-10 MED ORDER — CEFAZOLIN SODIUM-DEXTROSE 2-3 GM-% IV SOLR
INTRAVENOUS | Status: AC
  Filled 2013-12-10: qty 50

## 2013-12-10 MED ORDER — PROPOFOL 10 MG/ML IV BOLUS
INTRAVENOUS | Status: DC | PRN
  Administered 2013-12-10: 50 mg via INTRAVENOUS
  Administered 2013-12-10: 200 mg via INTRAVENOUS

## 2013-12-10 MED ORDER — MIDAZOLAM HCL 5 MG/5ML IJ SOLN
INTRAMUSCULAR | Status: DC | PRN
  Administered 2013-12-10: 2 mg via INTRAVENOUS

## 2013-12-10 MED ORDER — VECURONIUM BROMIDE 10 MG IV SOLR
INTRAVENOUS | Status: DC | PRN
  Administered 2013-12-10: 3 mg via INTRAVENOUS

## 2013-12-10 MED ORDER — ARTIFICIAL TEARS OP OINT
TOPICAL_OINTMENT | OPHTHALMIC | Status: AC
  Filled 2013-12-10: qty 3.5

## 2013-12-10 MED ORDER — EPHEDRINE SULFATE 50 MG/ML IJ SOLN
INTRAMUSCULAR | Status: AC
  Filled 2013-12-10: qty 1

## 2013-12-10 MED ORDER — ARTIFICIAL TEARS OP OINT
TOPICAL_OINTMENT | OPHTHALMIC | Status: DC | PRN
  Administered 2013-12-10: 1 via OPHTHALMIC

## 2013-12-10 MED ORDER — PHENYLEPHRINE HCL 10 MG/ML IJ SOLN
INTRAMUSCULAR | Status: DC | PRN
  Administered 2013-12-10: 80 ug via INTRAVENOUS

## 2013-12-10 MED ORDER — ROCURONIUM BROMIDE 50 MG/5ML IV SOLN
INTRAVENOUS | Status: AC
  Filled 2013-12-10: qty 1

## 2013-12-10 SURGICAL SUPPLY — 77 items
BANDAGE ELASTIC 3 VELCRO ST LF (GAUZE/BANDAGES/DRESSINGS) IMPLANT
BANDAGE ELASTIC 4 VELCRO ST LF (GAUZE/BANDAGES/DRESSINGS) IMPLANT
BANDAGE GAUZE ELAST BULKY 4 IN (GAUZE/BANDAGES/DRESSINGS) ×2 IMPLANT
BIT DRILL 3.2 QC DISP (BIT) ×2 IMPLANT
BIT DRILL 3.8X127 CALB (DRILL) ×2 IMPLANT
BIT DRILL 4.5 (BIT) ×2 IMPLANT
BNDG CMPR 9X4 STRL LF SNTH (GAUZE/BANDAGES/DRESSINGS) ×1
BNDG COHESIVE 4X5 TAN STRL (GAUZE/BANDAGES/DRESSINGS) ×3 IMPLANT
BNDG ESMARK 4X9 LF (GAUZE/BANDAGES/DRESSINGS) ×3 IMPLANT
CLEANER TIP ELECTROSURG 2X2 (MISCELLANEOUS) ×2 IMPLANT
CLOTH BEACON ORANGE TIMEOUT ST (SAFETY) ×3 IMPLANT
CORDS BIPOLAR (ELECTRODE) ×3 IMPLANT
COVER MAYO STAND STRL (DRAPES) ×3 IMPLANT
COVER SURGICAL LIGHT HANDLE (MISCELLANEOUS) ×3 IMPLANT
CUFF TOURNIQUET SINGLE 18IN (TOURNIQUET CUFF) IMPLANT
CUFF TOURNIQUET SINGLE 24IN (TOURNIQUET CUFF) IMPLANT
DRAPE INCISE IOBAN 66X45 STRL (DRAPES) ×3 IMPLANT
DRAPE OEC MINIVIEW 54X84 (DRAPES) IMPLANT
DRSG ADAPTIC 3X8 NADH LF (GAUZE/BANDAGES/DRESSINGS) ×2 IMPLANT
GAUZE XEROFORM 1X8 LF (GAUZE/BANDAGES/DRESSINGS) IMPLANT
GLOVE BIOGEL PI ORTHO PRO 7.5 (GLOVE) ×10
GLOVE BIOGEL PI ORTHO PRO SZ8 (GLOVE) ×2
GLOVE ORTHO TXT STRL SZ7.5 (GLOVE) ×3 IMPLANT
GLOVE PI ORTHO PRO STRL 7.5 (GLOVE) ×1 IMPLANT
GLOVE PI ORTHO PRO STRL SZ8 (GLOVE) ×1 IMPLANT
GLOVE SURG ORTHO 8.5 STRL (GLOVE) ×3 IMPLANT
GOWN STRL NON-REIN LRG LVL3 (GOWN DISPOSABLE) ×6 IMPLANT
GOWN STRL REIN XL XLG (GOWN DISPOSABLE) ×6 IMPLANT
KIT BASIN OR (CUSTOM PROCEDURE TRAY) ×3 IMPLANT
KIT ROOM TURNOVER OR (KITS) ×3 IMPLANT
LOOP VESSEL MAXI BLUE (MISCELLANEOUS) IMPLANT
MANIFOLD NEPTUNE II (INSTRUMENTS) ×3 IMPLANT
NDL HYPO 25GX1X1/2 BEV (NEEDLE) IMPLANT
NEEDLE HYPO 25GX1X1/2 BEV (NEEDLE) IMPLANT
NS IRRIG 1000ML POUR BTL (IV SOLUTION) ×3 IMPLANT
PACK ORTHO EXTREMITY (CUSTOM PROCEDURE TRAY) ×3 IMPLANT
PAD ARMBOARD 7.5X6 YLW CONV (MISCELLANEOUS) ×6 IMPLANT
PAD CAST 4YDX4 CTTN HI CHSV (CAST SUPPLIES) IMPLANT
PADDING CAST COTTON 4X4 STRL (CAST SUPPLIES) ×3
PASSER SUT SWANSON 36MM LOOP (INSTRUMENTS) ×12 IMPLANT
PLATE LOCK COMP 9H (Plate) ×2 IMPLANT
SCREW LOCK CORT HEX 4.5X22 (Screw) ×2 IMPLANT
SCREW LOCK CORT HEX 4.5X26 (Screw) ×4 IMPLANT
SCREW NLOCK CORT 4.5X26 (Screw) ×2 IMPLANT
SCREW NLOCK CORT 4.5X28 (Screw) ×6 IMPLANT
SCREW NLOCK CORT 4.5X30 (Screw) ×2 IMPLANT
SCREW NLOCK CORT 4.5X34 (Screw) ×2 IMPLANT
SCREW NLOCK CORT 4.5X40 (Screw) ×2 IMPLANT
SLING ARM FOAM STRAP XLG (SOFTGOODS) ×2 IMPLANT
SOLUTION BETADINE 4OZ (MISCELLANEOUS) ×3 IMPLANT
SPECIMEN JAR SMALL (MISCELLANEOUS) ×3 IMPLANT
SPLINT FIBERGLASS 4X15 (CAST SUPPLIES) ×2 IMPLANT
SPONGE GAUZE 4X4 12PLY (GAUZE/BANDAGES/DRESSINGS) IMPLANT
SPONGE GAUZE 4X4 12PLY STER LF (GAUZE/BANDAGES/DRESSINGS) ×2 IMPLANT
SPONGE LAP 18X18 X RAY DECT (DISPOSABLE) ×2 IMPLANT
SPONGE LAP 4X18 X RAY DECT (DISPOSABLE) ×6 IMPLANT
SPONGE SCRUB IODOPHOR (GAUZE/BANDAGES/DRESSINGS) ×3 IMPLANT
STAPLER VISISTAT 35W (STAPLE) ×2 IMPLANT
STOCKINETTE IMPERVIOUS LG (DRAPES) ×3 IMPLANT
SUCTION FRAZIER TIP 10 FR DISP (SUCTIONS) IMPLANT
SUT FIBERWIRE #2 38 T-5 BLUE (SUTURE) ×6
SUT MERSILENE 4 0 P 3 (SUTURE) IMPLANT
SUT MNCRL AB 4-0 PS2 18 (SUTURE) ×3 IMPLANT
SUT PROLENE 4 0 PS 2 18 (SUTURE) IMPLANT
SUT VIC AB 0 CT2 27 (SUTURE) ×6 IMPLANT
SUT VIC AB 0 CTX 36 (SUTURE) ×3
SUT VIC AB 0 CTX36XBRD ANTBCTR (SUTURE) IMPLANT
SUT VIC AB 2-0 CT1 27 (SUTURE) ×6
SUT VIC AB 2-0 CT1 TAPERPNT 27 (SUTURE) IMPLANT
SUTURE FIBERWR #2 38 T-5 BLUE (SUTURE) ×2 IMPLANT
SYR CONTROL 10ML LL (SYRINGE) IMPLANT
TOWEL OR 17X24 6PK STRL BLUE (TOWEL DISPOSABLE) ×3 IMPLANT
TOWEL OR 17X26 10 PK STRL BLUE (TOWEL DISPOSABLE) ×6 IMPLANT
TUBE CONNECTING 12'X1/4 (SUCTIONS)
TUBE CONNECTING 12X1/4 (SUCTIONS) IMPLANT
UNDERPAD 30X30 INCONTINENT (UNDERPADS AND DIAPERS) ×3 IMPLANT
WATER STERILE IRR 1000ML POUR (IV SOLUTION) ×3 IMPLANT

## 2013-12-10 NOTE — ED Notes (Signed)
Pt laptop, wallet, cellphone given to security. Pt belongings placed in bins down in emergency dept. Spoke with PACU nurses, to be informed of pt room number to bring belongings.
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
## 2013-12-10 NOTE — ED Notes (Signed)
Pt returned from CT °

## 2013-12-10 NOTE — ED Notes (Signed)
Pt transported to xray 

## 2013-12-10 NOTE — ED Notes (Signed)
Pt has returned from being out of the department; pt placed back on continuous pulse oximetry and blood pressure cuff 

## 2013-12-10 NOTE — ED Notes (Signed)
Still out of the dept at this time

## 2013-12-10 NOTE — ED Notes (Signed)
Md Harrison at bedside.  
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
## 2013-12-10 NOTE — ED Notes (Signed)
Dr. Romeo AppleHarrison and PA at bedside.

## 2013-12-10 NOTE — ED Notes (Signed)
Pt states hes feeling numbness and tingling in left hand. Pulses strong Left radial and cap refill less than 3 seconds.

## 2013-12-10 NOTE — Anesthesia Preprocedure Evaluation (Signed)
Anesthesia Evaluation  Patient identified by MRN, date of birth, ID band Patient awake    Reviewed: Allergy & Precautions, H&P , NPO status , Patient's Chart, lab work & pertinent test results  Airway Mallampati: II  Neck ROM: full    Dental   Pulmonary sleep apnea , former smoker,          Cardiovascular hypertension,     Neuro/Psych Depression  Neuromuscular disease    GI/Hepatic   Endo/Other  obese  Renal/GU      Musculoskeletal  (+) Arthritis -,   Abdominal   Peds  Hematology   Anesthesia Other Findings   Reproductive/Obstetrics                           Anesthesia Physical Anesthesia Plan  ASA: II  Anesthesia Plan: General   Post-op Pain Management:    Induction: Intravenous  Airway Management Planned: Oral ETT  Additional Equipment:   Intra-op Plan:   Post-operative Plan: Extubation in OR  Informed Consent: I have reviewed the patients History and Physical, chart, labs and discussed the procedure including the risks, benefits and alternatives for the proposed anesthesia with the patient or authorized representative who has indicated his/her understanding and acceptance.     Plan Discussed with: CRNA, Anesthesiologist and Surgeon  Anesthesia Plan Comments:         Anesthesia Quick Evaluation
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
## 2013-12-10 NOTE — ED Notes (Signed)
Pulses palpable in left arm, cap refill less than 3 seconds.
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
## 2013-12-10 NOTE — Anesthesia Procedure Notes (Signed)
Procedure Name: Intubation Date/Time: 12/10/2013 10:59 PM Performed by: Luster LandsbergHASE, Leaner Morici R Pre-anesthesia Checklist: Patient identified, Emergency Drugs available, Suction available and Patient being monitored Patient Re-evaluated:Patient Re-evaluated prior to inductionOxygen Delivery Method: Circle system utilized Preoxygenation: Pre-oxygenation with 100% oxygen Intubation Type: IV induction, Rapid sequence and Cricoid Pressure applied Laryngoscope Size: Mac and 3 Grade View: Grade III Tube type: Oral Tube size: 7.5 mm Number of attempts: 1 Airway Equipment and Method: Stylet Placement Confirmation: ETT inserted through vocal cords under direct vision,  positive ETCO2 and breath sounds checked- equal and bilateral Secured at: 23 cm Tube secured with: Tape Dental Injury: Teeth and Oropharynx as per pre-operative assessment

## 2013-12-10 NOTE — ED Notes (Signed)
Discussed with patient about holding pain medication until right before xray, pt agreed.

## 2013-12-11 ENCOUNTER — Emergency Department (HOSPITAL_COMMUNITY): Payer: BC Managed Care – PPO

## 2013-12-11 MED ORDER — CEFAZOLIN SODIUM-DEXTROSE 2-3 GM-% IV SOLR
2.0000 g | Freq: Three times a day (TID) | INTRAVENOUS | Status: DC
  Administered 2013-12-11 (×2): 2 g via INTRAVENOUS
  Filled 2013-12-11 (×4): qty 50

## 2013-12-11 MED ORDER — ESCITALOPRAM OXALATE 5 MG PO TABS
5.0000 mg | ORAL_TABLET | Freq: Every day | ORAL | Status: DC
  Filled 2013-12-11: qty 1

## 2013-12-11 MED ORDER — TURMERIC CURCUMIN 500 MG PO CAPS: 500.0000 mg | ORAL_CAPSULE | Freq: Every day | ORAL | Status: DC

## 2013-12-11 MED ORDER — GLYCOPYRROLATE 0.2 MG/ML IJ SOLN
INTRAMUSCULAR | Status: DC | PRN
  Administered 2013-12-11: 0.2 mg via INTRAVENOUS
  Administered 2013-12-11: 0.4 mg via INTRAVENOUS

## 2013-12-11 MED ORDER — DSS 100 MG PO CAPS: 100.0000 mg | ORAL_CAPSULE | Freq: Two times a day (BID) | ORAL | Status: DC

## 2013-12-11 MED ORDER — ADULT MULTIVITAMIN W/MINERALS CH
1.0000 | ORAL_TABLET | Freq: Every morning | ORAL | Status: DC
  Administered 2013-12-11: 1 via ORAL
  Filled 2013-12-11: qty 1

## 2013-12-11 MED ORDER — ONDANSETRON HCL 4 MG PO TABS: 4.0000 mg | ORAL_TABLET | Freq: Four times a day (QID) | ORAL | Status: DC | PRN

## 2013-12-11 MED ORDER — OXYCODONE HCL 5 MG/5ML PO SOLN: 5.0000 mg | Freq: Once | ORAL | Status: DC | PRN

## 2013-12-11 MED ORDER — LOSARTAN POTASSIUM-HCTZ 100-25 MG PO TABS: 1.0000 | ORAL_TABLET | Freq: Every day | ORAL | Status: DC

## 2013-12-11 MED ORDER — LABETALOL HCL 200 MG PO TABS
200.0000 mg | ORAL_TABLET | Freq: Two times a day (BID) | ORAL | Status: DC
  Administered 2013-12-11 (×2): 200 mg via ORAL
  Filled 2013-12-11 (×3): qty 1

## 2013-12-11 MED ORDER — ONDANSETRON HCL 4 MG/2ML IJ SOLN: 4.0000 mg | Freq: Four times a day (QID) | INTRAMUSCULAR | Status: DC | PRN

## 2013-12-11 MED ORDER — METHOCARBAMOL 500 MG PO TABS
500.0000 mg | ORAL_TABLET | Freq: Four times a day (QID) | ORAL | Status: DC | PRN
Start: 2013-12-11 — End: 2013-12-11
  Administered 2013-12-11 (×2): 500 mg via ORAL
  Filled 2013-12-11 (×2): qty 1

## 2013-12-11 MED ORDER — OXYCODONE-ACETAMINOPHEN 5-325 MG PO TABS
1.0000 | ORAL_TABLET | ORAL | Status: DC | PRN
  Administered 2013-12-11 (×2): 2 via ORAL
  Administered 2013-12-11: 1 via ORAL
  Filled 2013-12-11: qty 1
  Filled 2013-12-11 (×2): qty 2

## 2013-12-11 MED ORDER — ONDANSETRON HCL 4 MG/2ML IJ SOLN
INTRAMUSCULAR | Status: DC | PRN
  Administered 2013-12-11: 4 mg via INTRAVENOUS

## 2013-12-11 MED ORDER — DOXYCYCLINE HYCLATE 100 MG PO TABS
50.0000 mg | ORAL_TABLET | Freq: Every day | ORAL | Status: DC
Start: 2013-12-11 — End: 2013-12-11
  Filled 2013-12-11: qty 0.5

## 2013-12-11 MED ORDER — DOCUSATE SODIUM 100 MG PO CAPS
100.0000 mg | ORAL_CAPSULE | Freq: Two times a day (BID) | ORAL | Status: DC
  Administered 2013-12-11: 100 mg via ORAL
  Filled 2013-12-11 (×3): qty 1

## 2013-12-11 MED ORDER — AMLODIPINE BESYLATE 10 MG PO TABS
10.0000 mg | ORAL_TABLET | Freq: Every day | ORAL | Status: DC
  Administered 2013-12-11: 10 mg via ORAL
  Filled 2013-12-11: qty 1

## 2013-12-11 MED ORDER — OXYCODONE-ACETAMINOPHEN 5-325 MG PO TABS: 1.0000 | ORAL_TABLET | ORAL | Status: DC | PRN

## 2013-12-11 MED ORDER — METHOCARBAMOL 100 MG/ML IJ SOLN: 500.0000 mg | Freq: Four times a day (QID) | INTRAVENOUS | Status: DC | PRN

## 2013-12-11 MED ORDER — HYDROMORPHONE HCL PF 1 MG/ML IJ SOLN
0.5000 mg | INTRAMUSCULAR | Status: DC | PRN
  Administered 2013-12-11 (×5): 1 mg via INTRAVENOUS
  Filled 2013-12-11 (×5): qty 1

## 2013-12-11 MED ORDER — HYDROCHLOROTHIAZIDE 25 MG PO TABS
25.0000 mg | ORAL_TABLET | Freq: Every day | ORAL | Status: DC
  Administered 2013-12-11: 25 mg via ORAL
  Filled 2013-12-11: qty 1

## 2013-12-11 MED ORDER — HYDROMORPHONE HCL PF 1 MG/ML IJ SOLN
INTRAMUSCULAR | Status: AC
  Filled 2013-12-11: qty 1

## 2013-12-11 MED ORDER — LOSARTAN POTASSIUM 50 MG PO TABS
100.0000 mg | ORAL_TABLET | Freq: Every day | ORAL | Status: DC
  Administered 2013-12-11: 100 mg via ORAL
  Filled 2013-12-11: qty 2

## 2013-12-11 MED ORDER — DOXYCYCLINE MONOHYDRATE 50 MG PO TABS: 50.0000 mg | ORAL_TABLET | Freq: Every day | ORAL | Status: DC

## 2013-12-11 MED ORDER — HYDROMORPHONE HCL PF 1 MG/ML IJ SOLN
0.2500 mg | INTRAMUSCULAR | Status: DC | PRN
  Administered 2013-12-11 (×2): 0.5 mg via INTRAVENOUS

## 2013-12-11 MED ORDER — OXYCODONE HCL 5 MG PO TABS: 5.0000 mg | ORAL_TABLET | Freq: Once | ORAL | Status: DC | PRN

## 2013-12-11 MED ORDER — ATROPINE SULFATE 0.1 MG/ML IJ SOLN
INTRAMUSCULAR | Status: AC
  Filled 2013-12-11: qty 10

## 2013-12-11 MED ORDER — METHOCARBAMOL 500 MG PO TABS: 500.0000 mg | ORAL_TABLET | Freq: Four times a day (QID) | ORAL | Status: DC | PRN

## 2013-12-11 MED ORDER — NEOSTIGMINE METHYLSULFATE 1 MG/ML IJ SOLN
INTRAMUSCULAR | Status: DC | PRN
  Administered 2013-12-11: 3 mg via INTRAVENOUS

## 2013-12-11 NOTE — Progress Notes (Signed)
See treatment team sticky note r/t valuables.Pt given yellow copy # [id number] to use to pick up valuables at time of d/c.Informed hosp not responsible for any valuables left at the bedside. Linward HeadlandBeverly, Azavion Bouillon D

## 2013-12-11 NOTE — Anesthesia Postprocedure Evaluation (Signed)
  Anesthesia Post-op Note  Patient: [name] [name]  Procedure(s) Performed: Procedure(s): OPEN REDUCTION INTERNAL FIXATION (ORIF) HUMERAL SHAFT FRACTURE (Left)  Patient Location: PACU  Anesthesia Type:General  Level of Consciousness: awake, alert  and oriented  Airway and Oxygen Therapy: Patient Spontanous Breathing and Patient connected to nasal cannula oxygen  Post-op Pain: none  Post-op Assessment: Post-op Vital signs reviewed, Patient's Cardiovascular Status Stable, Respiratory Function Stable, Patent Airway, No signs of Nausea or vomiting, Adequate PO intake and Pain level controlled  Post-op Vital Signs: Reviewed and stable  Complications: No apparent anesthesia complications

## 2013-12-11 NOTE — Progress Notes (Signed)
Utilization Review Completed.   Elchanan Bob, RN, BSN Nurse Case Manager  
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
## 2013-12-11 NOTE — Progress Notes (Signed)
Clinical Child psychotherapistocial Worker (CSW) received call from [employer] stating that [facility] because he is on a narcotic. [facility] reported that patient can be on medical leave until he gets his pain under control. Patient reported that he is going to stay at a hotel tonight and go back [facility]. Please reconsult if further social work needs arise. CSW signing off.   Jetta LoutBailey Morgan, LCSWA Weekend CSW [phone]

## 2013-12-11 NOTE — Transfer of Care (Signed)
Immediate Anesthesia Transfer of Care Note  Patient: [name] [name]  Procedure(s) Performed: Procedure(s): OPEN REDUCTION INTERNAL FIXATION (ORIF) HUMERAL SHAFT FRACTURE (Left)  Patient Location: PACU  Anesthesia Type:General  Level of Consciousness: awake, alert  and oriented  Airway & Oxygen Therapy: Patient Spontanous Breathing and Patient connected to nasal cannula oxygen  Post-op Assessment: Report given to PACU RN, Post -op Vital signs reviewed and stable, Patient moving all extremities and Patient able to stick tongue midline  Post vital signs: Reviewed and stable  Complications: No apparent anesthesia complications
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
## 2013-12-11 NOTE — Discharge Instructions (Signed)
Discharge Instructions    You have a dressing with a plaster splint incorporated in it. Move your fingers as much as possible, making a full fist and fully opening the fist. Elevate your hand to reduce pain & swelling of the digits.  Ice over the operative site may be helpful to reduce pain & swelling.  DO NOT USE HEAT. Pain medicine has been prescribed for you.  Use your medicine as needed over the first 48 hours, and then you can begin to taper your use.  You may use Tylenol in place of your prescribed pain medication, but not IN ADDITION to it. Leave the dressing in place until you return to our office.  You may shower, but keep the bandage clean & dry.  You may drive a car when you are off of prescription pain medications and can safely control your vehicle with both hands. Please call our office today or the next business day to make your return appointment for 10-15 days after surgery.    Please call [phone] during normal business hours or [phone] after hours for any problems. Including the following:  - excessive redness of the incisions - drainage for more than 4 days - fever of more than 101.5 F  *Please note that pain medications will not be refilled after hours or on weekends.

## 2013-12-11 NOTE — Progress Notes (Signed)
PHARMACIST - PHYSICIAN ORDER COMMUNICATION  CONCERNING: P&T Medication Policy on Herbal Medications  DESCRIPTION:  This patient's order for:  Turmeric Curcumin  has been noted.  This product(s) is classified as an "herbal" or natural product. Due to a lack of definitive safety studies or FDA approval, nonstandard manufacturing practices, plus the potential risk of unknown drug-drug interactions while on inpatient medications, the Pharmacy and Therapeutics Committee does not permit the use of "herbal" or natural products of this type within [place].   ACTION TAKEN: The pharmacy department is unable to verify this order at this time and your patient has been informed of this safety policy. Please reevaluate patient's clinical condition at discharge and address if the herbal or natural product(s) should be resumed at that time.   
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
## 2013-12-16 ENCOUNTER — Encounter (HOSPITAL_COMMUNITY): Payer: Self-pay | Admitting: Orthopedic Surgery

## 2014-02-24 ENCOUNTER — Other Ambulatory Visit: Payer: Self-pay | Admitting: Physician Assistant

## 2014-02-24 ENCOUNTER — Other Ambulatory Visit: Payer: Self-pay | Admitting: Emergency Medicine
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
## 2014-03-20 ENCOUNTER — Ambulatory Visit: Payer: BC Managed Care – PPO | Admitting: Internal Medicine

## 2014-03-20 VITALS — BP 122/76 | HR 70 | Temp 98.2°F | Resp 16 | Ht 71.0 in | Wt 251.0 lb

## 2014-03-20 DIAGNOSIS — M129 Arthropathy, unspecified: Secondary | ICD-10-CM

## 2014-03-20 DIAGNOSIS — I10 Essential (primary) hypertension: Secondary | ICD-10-CM

## 2014-03-20 DIAGNOSIS — G894 Chronic pain syndrome: Secondary | ICD-10-CM

## 2014-03-20 DIAGNOSIS — M1990 Unspecified osteoarthritis, unspecified site: Secondary | ICD-10-CM

## 2014-03-20 DIAGNOSIS — Z79899 Other long term (current) drug therapy: Secondary | ICD-10-CM

## 2014-03-20 LAB — POCT UA - MICROSCOPIC ONLY
Bacteria, U Microscopic: NEGATIVE
CASTS, UR, LPF, POC: NEGATIVE
CRYSTALS, UR, HPF, POC: NEGATIVE
Epithelial cells, urine per micros: NEGATIVE
Mucus, UA: NEGATIVE
RBC, urine, microscopic: NEGATIVE
WBC, Ur, HPF, POC: NEGATIVE
Yeast, UA: NEGATIVE

## 2014-03-20 LAB — COMPLETE METABOLIC PANEL WITH GFR
ALBUMIN: 4.5 g/dL (ref 3.5–5.2)
ALT: 23 U/L (ref 0–53)
AST: 29 U/L (ref 0–37)
Alkaline Phosphatase: 146 U/L — ABNORMAL HIGH (ref 39–117)
BUN: 18 mg/dL (ref 6–23)
CHLORIDE: 102 meq/L (ref 96–112)
CO2: 29 mEq/L (ref 19–32)
Calcium: 9.9 mg/dL (ref 8.4–10.5)
Creat: 0.82 mg/dL (ref 0.50–1.35)
GFR, Est African American: >89 mL/min
GFR, Est Non African American: >89 mL/min
Glucose, Bld: 106 mg/dL — ABNORMAL HIGH (ref 70–99)
POTASSIUM: 3.9 meq/L (ref 3.5–5.3)
Sodium: 140 mEq/L (ref 135–145)
TOTAL PROTEIN: 7.4 g/dL (ref 6.0–8.3)
Total Bilirubin: 0.4 mg/dL (ref 0.2–1.2)

## 2014-03-20 LAB — LIPID PANEL
CHOL/HDL RATIO: 4.7 ratio
Cholesterol: 199 mg/dL (ref 0–200)
HDL: 42 mg/dL (ref >39)
LDL Cholesterol: 123 mg/dL — ABNORMAL HIGH (ref 0–99)
Triglycerides: 170 mg/dL — ABNORMAL HIGH (ref <150)
VLDL: 34 mg/dL (ref 0–40)

## 2014-03-20 LAB — POCT URINALYSIS DIPSTICK
Bilirubin, UA: NEGATIVE
Blood, UA: NEGATIVE
Glucose, UA: NEGATIVE
KETONES UA: NEGATIVE
LEUKOCYTES UA: NEGATIVE
Nitrite, UA: NEGATIVE
PH UA: 7
PROTEIN UA: NEGATIVE
SPEC GRAV UA: 1.01
Urobilinogen, UA: 0.2

## 2014-03-20 LAB — POCT CBC
GRANULOCYTE PERCENT: 68.6 % (ref 37–80)
HCT, POC: 43.3 % — AB (ref 43.5–53.7)
Hemoglobin: 14 g/dL — AB (ref 14.1–18.1)
Lymph, poc: 2.6 (ref 0.6–3.4)
MCH, POC: 32.9 pg — AB (ref 27–31.2)
MCHC: 32.3 g/dL (ref 31.8–35.4)
MCV: 101.6 fL — AB (ref 80–97)
MID (cbc): 0.5 (ref 0–0.9)
MPV: 7 fL (ref 0–99.8)
PLATELET COUNT, POC: 321 10*3/uL (ref 142–424)
POC Granulocyte: 6.9 (ref 2–6.9)
POC LYMPH PERCENT: 26 %L (ref 10–50)
POC MID %: 5.4 % (ref 0–12)
RBC: 4.26 M/uL — AB (ref 4.69–6.13)
RDW, POC: 15.2 %
WBC: 10.1 10*3/uL (ref 4.6–10.2)

## 2014-03-20 LAB — TSH: TSH: 1.87 u[IU]/mL (ref 0.350–4.500)

## 2014-03-20 LAB — PSA: PSA: 3.29 ng/mL (ref <=4.00)

## 2014-03-20 MED ORDER — LOSARTAN POTASSIUM-HCTZ 100-25 MG PO TABS: 1.0000 | ORAL_TABLET | Freq: Every day | ORAL | Status: DC

## 2014-03-20 MED ORDER — AMLODIPINE BESYLATE 10 MG PO TABS: 10.0000 mg | ORAL_TABLET | Freq: Every day | ORAL | Status: DC

## 2014-03-20 MED ORDER — METHOCARBAMOL 500 MG PO TABS: 500.0000 mg | ORAL_TABLET | Freq: Four times a day (QID) | ORAL | Status: DC | PRN

## 2014-03-20 MED ORDER — LABETALOL HCL 200 MG PO TABS: 200.0000 mg | ORAL_TABLET | Freq: Two times a day (BID) | ORAL | Status: DC

## 2014-03-20 NOTE — Progress Notes (Signed)
   Subjective:    Patient ID: [name] [name], male    DOB: 1951-03-01, 63 y.o.   MRN: [id number]  HPI  Patient presents today for medication refills. He has no complaints. He has hypertension and arthritis and Chronic pain. He is just in need of medication refill. Feels great   Review of Systems     Objective:   Physical Exam  Constitutional: He is oriented to person, place, and time. He appears well-developed and well-nourished.  HENT:  Head: Normocephalic.  Eyes: EOM are normal.  Neck: Normal range of motion.  Cardiovascular: Normal rate, regular rhythm and normal heart sounds.   Pulmonary/Chest: Effort normal and breath sounds normal.  Musculoskeletal: Normal range of motion.  Neurological: He is alert and oriented to person, place, and time. He exhibits normal muscle tone. Coordination normal.  Psychiatric: He has a normal mood and affect.     Results for orders placed in visit on 03/20/14  POCT UA - MICROSCOPIC ONLY      Result Value Ref Range   WBC, Ur, HPF, POC neg     RBC, urine, microscopic neg     Bacteria, U Microscopic neg     Mucus, UA neg     Epithelial cells, urine per micros neg     Crystals, Ur, HPF, POC neg     Casts, Ur, LPF, POC neg     Yeast, UA neg    POCT URINALYSIS DIPSTICK      Result Value Ref Range   Color, UA yellow     Clarity, UA clear     Glucose, UA neg     Bilirubin, UA neg     Ketones, UA neg     Spec Grav, UA 1.010     Blood, UA neg     pH, UA 7.0     Protein, UA neg     Urobilinogen, UA 0.2     Nitrite, UA neg     Leukocytes, UA Negative    POCT CBC      Result Value Ref Range   WBC 10.1  4.6 - 10.2 K/uL   Lymph, poc 2.6  0.6 - 3.4   POC LYMPH PERCENT 26.0  10 - 50 %L   MID (cbc) 0.5  0 - 0.9   POC MID % 5.4  0 - 12 %M   POC Granulocyte 6.9  2 - 6.9   Granulocyte percent 68.6  37 - 80 %G   RBC 4.26 (*) 4.69 - 6.13 M/uL   Hemoglobin 14.0 (*) 14.1 - 18.1 g/dL   HCT, POC 32.443.3 (*) 40.143.5 - 53.7 %   MCV 101.6 (*) 80 - 97 fL    MCH, POC 32.9 (*) 27 - 31.2 pg   MCHC 32.3  31.8 - 35.4 g/dL   RDW, POC 02.715.2     Platelet Count, POC 321  142 - 424 K/uL   MPV 7.0  0 - 99.8 fL        Assessment & Plan:  Med rfs CPE soon

## 2014-03-20 NOTE — Patient Instructions (Addendum)
Cholesterol Cholesterol is a white, waxy, fat-like protein needed by your body in small amounts. The liver makes all the cholesterol you need. It is carried from the liver by the blood through the blood vessels. Deposits (plaque) may build up on blood vessel walls. This makes the arteries narrower and stiffer. Plaque increases the risk for heart attack and stroke. You cannot feel your cholesterol level even if it is very high. The only way to know is by a blood test to check your lipid (fats) levels. Once you know your cholesterol levels, you should keep a record of the test results. Work with your caregiver to to keep your levels in the desired range. WHAT THE RESULTS MEAN:  Total cholesterol is a rough measure of all the cholesterol in your blood.  LDL is the so-called bad cholesterol. This is the type that deposits cholesterol in the walls of the arteries. You want this level to be low.  HDL is the good cholesterol because it cleans the arteries and carries the LDL away. You want this level to be high.  Triglycerides are fat that the body can either burn for energy or store. High levels are closely linked to heart disease. DESIRED LEVELS:  Total cholesterol below 200.  LDL below 100 for people at risk, below 70 for very high risk.  HDL above 50 is good, above 60 is best.  Triglycerides below 150. HOW TO LOWER YOUR CHOLESTEROL:  Diet.  Choose fish or white meat chicken and [place], roasted or baked. Limit fatty cuts of red meat, fried foods, and processed meats, such as sausage and lunch meat.  Eat lots of fresh fruits and vegetables. Choose whole grains, beans, pasta, potatoes and cereals.  Use only small amounts of olive, corn or canola oils. Avoid butter, mayonnaise, shortening or palm kernel oils. Avoid foods with trans-fats.  Use skim/nonfat milk and low-fat/nonfat yogurt and cheeses. Avoid whole milk, cream, ice cream, egg yolks and cheeses. Healthy desserts include angel food  cake, ginger snaps, animal crackers, hard candy, popsicles, and low-fat/nonfat frozen yogurt. Avoid pastries, cakes, pies and cookies.  Exercise.  A regular program helps decrease LDL and raises HDL.  Helps with weight control.  Do things that increase your activity level like gardening, walking, or taking the stairs.  Medication.  May be prescribed by your caregiver to help lowering cholesterol and the risk for heart disease.  You may need medicine even if your levels are normal if you have several risk factors. HOME CARE INSTRUCTIONS   Follow your diet and exercise programs as suggested by your caregiver.  Take medications as directed.  Have blood work done when your caregiver feels it is necessary. MAKE SURE YOU:   Understand these instructions.  Will watch your condition.  Will get help right away if you are not doing well or get worse. Document Released: 07/29/2001 Document Revised: 01/26/2012 Document Reviewed: 08/17/2013 [facility] Patient Information 2014 [place], [place]. Immunization Schedule, Adult  Influenza vaccine.  All adults should be immunized every year.  All adults, including pregnant women and people with hives-only allergy to eggs can receive the inactivated influenza (IIV) vaccine.  Adults aged 50 49 years can receive the recombinant influenza (RIV) vaccine. The RIV vaccine does not contain any egg protein.  Adults aged 61 years or older can receive the standard-dose IIV or the high-dose IIV.  Tetanus, diphtheria, and acellular pertussis (Td, Tdap) vaccine.  Pregnant women should receive 1 dose of Tdap vaccine during each pregnancy. The dose  should be obtained regardless of the length of time since the last dose. Immunization is preferred during the 27th to 36th week of gestation.  An adult who has not previously received Tdap or who does not know his or her vaccine status should receive 1 dose of Tdap. This initial dose should be followed by tetanus  and diphtheria toxoids (Td) booster doses every 10 years.  Adults with an unknown or incomplete history of completing a 3-dose immunization series with Td-containing vaccines should begin or complete a primary immunization series including a Tdap dose.  Adults should receive a Td booster every 10 years.  Varicella vaccine.  An adult without evidence of immunity to varicella should receive 2 doses or a second dose if he or she has previously received 1 dose.  Pregnant females who do not have evidence of immunity should receive the first dose after pregnancy. This first dose should be obtained before leaving the health care facility. The second dose should be obtained 4 8 weeks after the first dose.  Human papillomavirus (HPV) vaccine.  Females aged 57 26 years who have not received the vaccine previously should obtain the 3-dose series.  The vaccine is not recommended for use in pregnant females. However, pregnancy testing is not needed before receiving a dose. If a male is found to be pregnant after receiving a dose, no treatment is needed. In that case, the remaining doses should be delayed until after the pregnancy.  Males aged 31 21 years who have not received the vaccine previously should receive the 3-dose series. Males aged 46 26 years may be immunized.  Immunization is recommended through the age of 79 years for any male who has sex with males and did not get any or all doses earlier.  Immunization is recommended for any person with an immunocompromised condition through the age of 67 years if he or she did not get any or all doses earlier.  During the 3-dose series, the second dose should be obtained 4 8 weeks after the first dose. The third dose should be obtained 24 weeks after the first dose and 16 weeks after the second dose.  Zoster vaccine.  One dose is recommended for adults aged 43 years or older unless certain conditions are present.  Measles, mumps, and rubella (MMR)  vaccine.  Adults born before [birth year] generally are considered immune to measles and mumps.  Adults born in [birth year] or later should have 1 or more doses of MMR vaccine unless there is a contraindication to the vaccine or there is laboratory evidence of immunity to each of the three diseases.  A routine second dose of MMR vaccine should be obtained at least 28 days after the first dose for students attending postsecondary schools, health care workers, or international travelers.  People who received inactivated measles vaccine or an unknown type of measles vaccine during 1963 1967 should receive 2 doses of MMR vaccine.  People who received inactivated mumps vaccine or an unknown type of mumps vaccine before 1979 and are at high risk for mumps infection should consider immunization with 2 doses of MMR vaccine.  For females of childbearing age, rubella immunity should be determined. If there is no evidence of immunity, females who are not pregnant should be vaccinated. If there is no evidence of immunity, females who are pregnant should delay immunization until after pregnancy.  Unvaccinated health care workers born before 1957 who lack laboratory evidence of measles, mumps, or rubella immunity or laboratory confirmation of disease  should consider measles and mumps immunization with 2 doses of MMR vaccine or rubella immunization with 1 dose of MMR vaccine.  Pneumococcal 13-valent conjugate (PCV13) vaccine.  When indicated, a person who is uncertain of his or her immunization history and has no record of immunization should receive the PCV13 vaccine.  An adult aged 11 years or older who has certain medical conditions and has not been previously immunized should receive 1 dose of PCV13 vaccine. This PCV13 should be followed with a dose of pneumococcal polysaccharide (PPSV23) vaccine. The PPSV23 vaccine dose should be obtained at least 8 weeks after the dose of PCV13 vaccine.  An adult aged 90 years or  older who has certain medical conditions and previously received 1 or more doses of PPSV23 vaccine should receive 1 dose of PCV13. The PCV13 vaccine dose should be obtained 1 or more years after the last PPSV23 vaccine dose.  Pneumococcal polysaccharide (PPSV23) vaccine.  When PCV13 is also indicated, PCV13 should be obtained first.  All adults aged 18 years and older should be immunized.  An adult younger than age 71 years who has certain medical conditions should be immunized.  Any person who resides in a nursing home or long-term care facility should be immunized.  An adult smoker should be immunized.  People with an immunocompromised condition and certain other conditions should receive both PCV13 and PPSV23 vaccines.  People with human immunodeficiency virus (HIV) infection should be immunized as soon as possible after diagnosis.  Immunization during chemotherapy or radiation therapy should be avoided.  Routine use of PPSV23 vaccine is not recommended for American Indians, [place] Natives, or people younger than 65 years unless there are medical conditions that require PPSV23 vaccine.  When indicated, people who have unknown immunization and have no record of immunization should receive PPSV23 vaccine.  One-time revaccination 5 years after the first dose of PPSV23 is recommended for people aged 72 64 years who have chronic kidney failure, nephrotic syndrome, asplenia, or immunocompromised conditions.  People who received 1 2 doses of PPSV23 before age 49 years should receive another dose of PPSV23 vaccine at age 35 years or later if at least 5 years have passed since the previous dose.  Doses of PPSV23 are not needed for people immunized with PPSV23 at or after age 2 years.  Meningococcal vaccine.  Adults with asplenia or persistent complement component deficiencies should receive 2 doses of quadrivalent meningococcal conjugate (MenACWY-D) vaccine. The doses should be obtained at  least 2 months apart.  Microbiologists working with certain meningococcal bacteria, [place] recruits, people at risk during an outbreak, and people who travel to or live in countries with a high rate of meningitis should be immunized.  A first-year college student up through age 47 years who is living in a residence hall should receive a dose if he or she did not receive a dose on or after his or her 16th birthday.  Adults who have certain high-risk conditions should receive one or more doses of vaccine.  Hepatitis A vaccine.  Adults who wish to be protected from this disease, have certain high-risk conditions, work with hepatitis A-infected animals, work in hepatitis A research labs, or travel to or work in countries with a high rate of hepatitis A should be immunized.  Adults who were previously unvaccinated and who anticipate close contact with an international adoptee during the first 60 days after arrival in the [place] from a country with a high rate of hepatitis A should be  immunized.  Hepatitis B vaccine.  Adults who wish to be protected from this disease, have certain high-risk conditions, may be exposed to blood or other infectious body fluids, are household contacts or sex partners of hepatitis B positive people, are clients or workers in certain care facilities, or travel to or work in countries with a high rate of hepatitis B should be immunized.  Haemophilus influenzae type b (Hib) vaccine.  A previously unvaccinated person with asplenia or sickle cell disease or having a scheduled splenectomy should receive 1 dose of Hib vaccine.  Regardless of previous immunization, a recipient of a hematopoietic stem cell transplant should receive a 3-dose series 6 12 months after his or her successful transplant.  Hib vaccine is not recommended for adults with HIV infection. Document Released: 01/24/2004 Document Revised: 02/28/2013 Document Reviewed: 12/21/2012 [facility] Patient  Information 2014 [place], [place].

## 2014-03-20 NOTE — Progress Notes (Signed)
   Subjective:    Patient ID: [name] [name], male    DOB: Jul 24, 1951, 63 y.o.   MRN: [id number]  HPI    Review of Systems     Objective:   Physical Exam        Assessment & Plan:

## 2014-04-06 ENCOUNTER — Other Ambulatory Visit: Payer: Self-pay | Admitting: Physician Assistant

## 2014-04-07 NOTE — Telephone Encounter (Signed)
Checked w/ pt that he has been taking the 20 mg Lexapro, not the 5 mg as on med list. He stated that several providers have Rxd that for him from our practice, usually [facility], and he has been taking the 20 mg for quite some time. RFd per Dr Ernestene Mention med refill OV

## 2014-04-14 ENCOUNTER — Telehealth: Payer: Self-pay

## 2014-04-14 NOTE — Telephone Encounter (Signed)
Pt called in regards to labs and mention of referral to Urology. He would like to make an appt with Chelle to f/u first. Can we make an appt for him with her for 1 month out or so. Thanks

## 2014-05-18 ENCOUNTER — Encounter: Payer: Self-pay | Admitting: Physician Assistant

## 2014-05-18 ENCOUNTER — Ambulatory Visit (INDEPENDENT_AMBULATORY_CARE_PROVIDER_SITE_OTHER): Payer: BC Managed Care – PPO | Admitting: Physician Assistant

## 2014-05-18 VITALS — BP 120/60 | HR 67 | Temp 99.4°F | Resp 16 | Ht 72.0 in | Wt 247.6 lb

## 2014-05-18 DIAGNOSIS — Z0000 Encounter for general adult medical examination without abnormal findings: Secondary | ICD-10-CM

## 2014-05-18 DIAGNOSIS — Z1211 Encounter for screening for malignant neoplasm of colon: Secondary | ICD-10-CM

## 2014-05-18 DIAGNOSIS — Z125 Encounter for screening for malignant neoplasm of prostate: Secondary | ICD-10-CM

## 2014-05-18 DIAGNOSIS — F339 Major depressive disorder, recurrent, unspecified: Secondary | ICD-10-CM

## 2014-05-18 DIAGNOSIS — E785 Hyperlipidemia, unspecified: Secondary | ICD-10-CM

## 2014-05-18 DIAGNOSIS — Z23 Encounter for immunization: Secondary | ICD-10-CM

## 2014-05-18 DIAGNOSIS — I10 Essential (primary) hypertension: Secondary | ICD-10-CM

## 2014-05-18 DIAGNOSIS — Z1159 Encounter for screening for other viral diseases: Secondary | ICD-10-CM

## 2014-05-18 DIAGNOSIS — G4730 Sleep apnea, unspecified: Secondary | ICD-10-CM

## 2014-05-18 LAB — POCT URINALYSIS DIPSTICK
BILIRUBIN UA: NEGATIVE
Glucose, UA: NEGATIVE
KETONES UA: NEGATIVE
Leukocytes, UA: NEGATIVE
Nitrite, UA: NEGATIVE
Protein, UA: NEGATIVE
RBC UA: NEGATIVE
Spec Grav, UA: 1.015
Urobilinogen, UA: 0.2
pH, UA: 7

## 2014-05-18 LAB — COMPLETE METABOLIC PANEL WITH GFR
ALT: 22 U/L (ref 0–53)
AST: 16 U/L (ref 0–37)
Albumin: 4.3 g/dL (ref 3.5–5.2)
Alkaline Phosphatase: 108 U/L (ref 39–117)
BUN: 17 mg/dL (ref 6–23)
CALCIUM: 9.4 mg/dL (ref 8.4–10.5)
CHLORIDE: 102 meq/L (ref 96–112)
CO2: 31 mEq/L (ref 19–32)
CREATININE: 0.8 mg/dL (ref 0.50–1.35)
GFR, Est African American: >89 mL/min
GFR, Est Non African American: >89 mL/min
Glucose, Bld: 112 mg/dL — ABNORMAL HIGH (ref 70–99)
Potassium: 3.7 mEq/L (ref 3.5–5.3)
Sodium: 141 mEq/L (ref 135–145)
Total Bilirubin: 0.5 mg/dL (ref 0.2–1.2)
Total Protein: 6.9 g/dL (ref 6.0–8.3)

## 2014-05-18 LAB — CBC WITH DIFFERENTIAL/PLATELET
BASOS ABS: 0 10*3/uL (ref 0.0–0.1)
Basophils Relative: 0 % (ref 0–1)
EOS PCT: 1 % (ref 0–5)
Eosinophils Absolute: 0.1 10*3/uL (ref 0.0–0.7)
HCT: 38.7 % — ABNORMAL LOW (ref 39.0–52.0)
Hemoglobin: 13.6 g/dL (ref 13.0–17.0)
LYMPHS PCT: 23 % (ref 12–46)
Lymphs Abs: 2.2 10*3/uL (ref 0.7–4.0)
MCH: 33 pg (ref 26.0–34.0)
MCHC: 35.1 g/dL (ref 30.0–36.0)
MCV: 93.9 fL (ref 78.0–100.0)
Monocytes Absolute: 0.8 10*3/uL (ref 0.1–1.0)
Monocytes Relative: 8 % (ref 3–12)
NEUTROS ABS: 6.6 10*3/uL (ref 1.7–7.7)
Neutrophils Relative %: 68 % (ref 43–77)
Platelets: 310 10*3/uL (ref 150–400)
RBC: 4.12 MIL/uL — ABNORMAL LOW (ref 4.22–5.81)
RDW: 15.2 % (ref 11.5–15.5)
WBC: 9.7 10*3/uL (ref 4.0–10.5)

## 2014-05-18 LAB — LIPID PANEL
CHOLESTEROL: 157 mg/dL (ref 0–200)
HDL: 47 mg/dL (ref >39)
LDL Cholesterol: 94 mg/dL (ref 0–99)
TRIGLYCERIDES: 79 mg/dL (ref <150)
Total CHOL/HDL Ratio: 3.3 Ratio
VLDL: 16 mg/dL (ref 0–40)

## 2014-05-18 LAB — IFOBT (OCCULT BLOOD): IFOBT: NEGATIVE

## 2014-05-18 MED ORDER — LABETALOL HCL 200 MG PO TABS: 200.0000 mg | ORAL_TABLET | Freq: Two times a day (BID) | ORAL | Status: AC

## 2014-05-18 MED ORDER — AMLODIPINE BESYLATE 10 MG PO TABS: 10.0000 mg | ORAL_TABLET | Freq: Every day | ORAL | Status: AC

## 2014-05-18 MED ORDER — LOSARTAN POTASSIUM-HCTZ 100-25 MG PO TABS: 1.0000 | ORAL_TABLET | Freq: Every day | ORAL | Status: AC

## 2014-05-18 MED ORDER — ESCITALOPRAM OXALATE 20 MG PO TABS: ORAL_TABLET | ORAL | Status: AC

## 2014-05-18 NOTE — Patient Instructions (Signed)
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
## 2014-05-19 ENCOUNTER — Encounter: Payer: Self-pay | Admitting: Physician Assistant

## 2014-05-19 LAB — PSA: PSA: 4.18 ng/mL — ABNORMAL HIGH (ref <=4.00)

## 2014-05-19 LAB — TSH: TSH: 1.816 u[IU]/mL (ref 0.350–4.500)

## 2014-05-22 ENCOUNTER — Encounter: Payer: Self-pay | Admitting: *Deleted

## 2014-08-17 DEATH — deceased

## 2015-02-15 NOTE — Telephone Encounter (Signed)
Closing this encounter problem has been addressed  ° °
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
## 2015-09-04 IMAGING — RF DG HUMERUS 2V *L*
1 series · 6 of 6 positions shown · non-contrast
Comparison: Left humerus radiographs performed earlier today at
[DATE] p.m.

CLINICAL DATA: Internal fixation of left humeral fracture.

EXAM:
LEFT HUMERUS - 2+ VIEW; DG C-ARM 61-120 MIN

[Series 1: run · 6 of 6 slices shown]
[im 1/6]
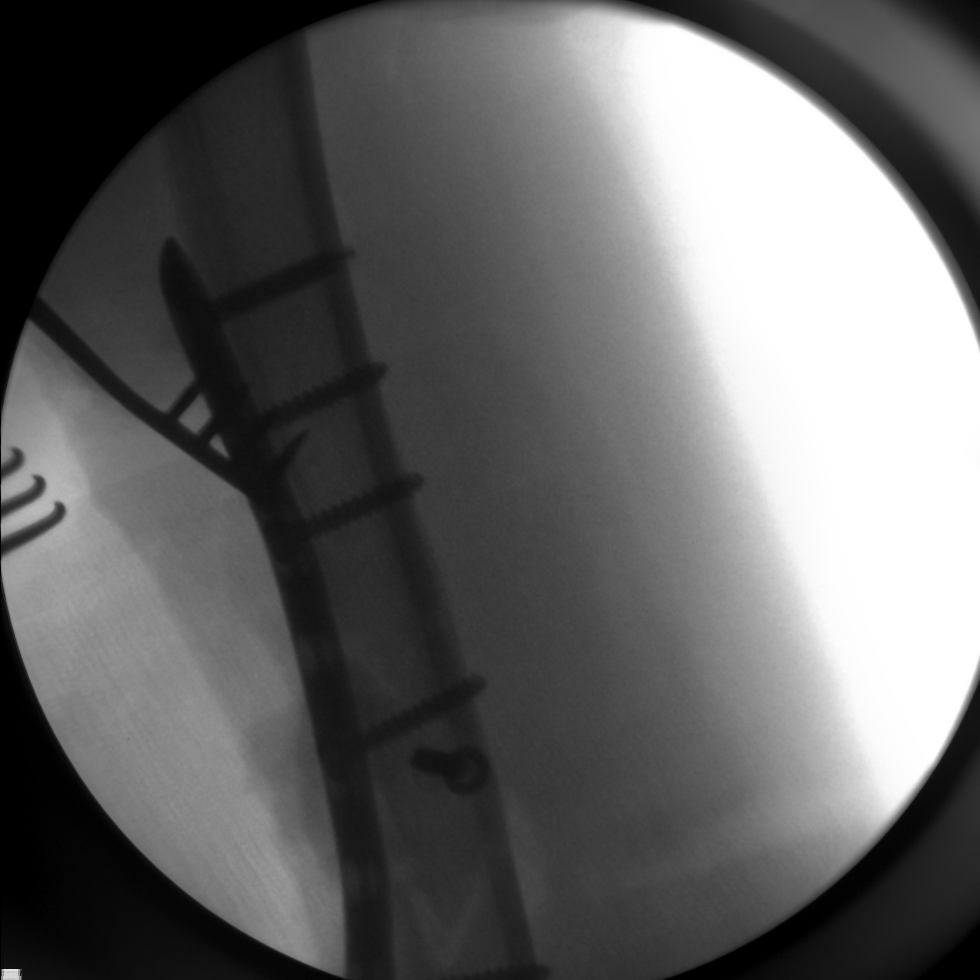
[im 2/6]
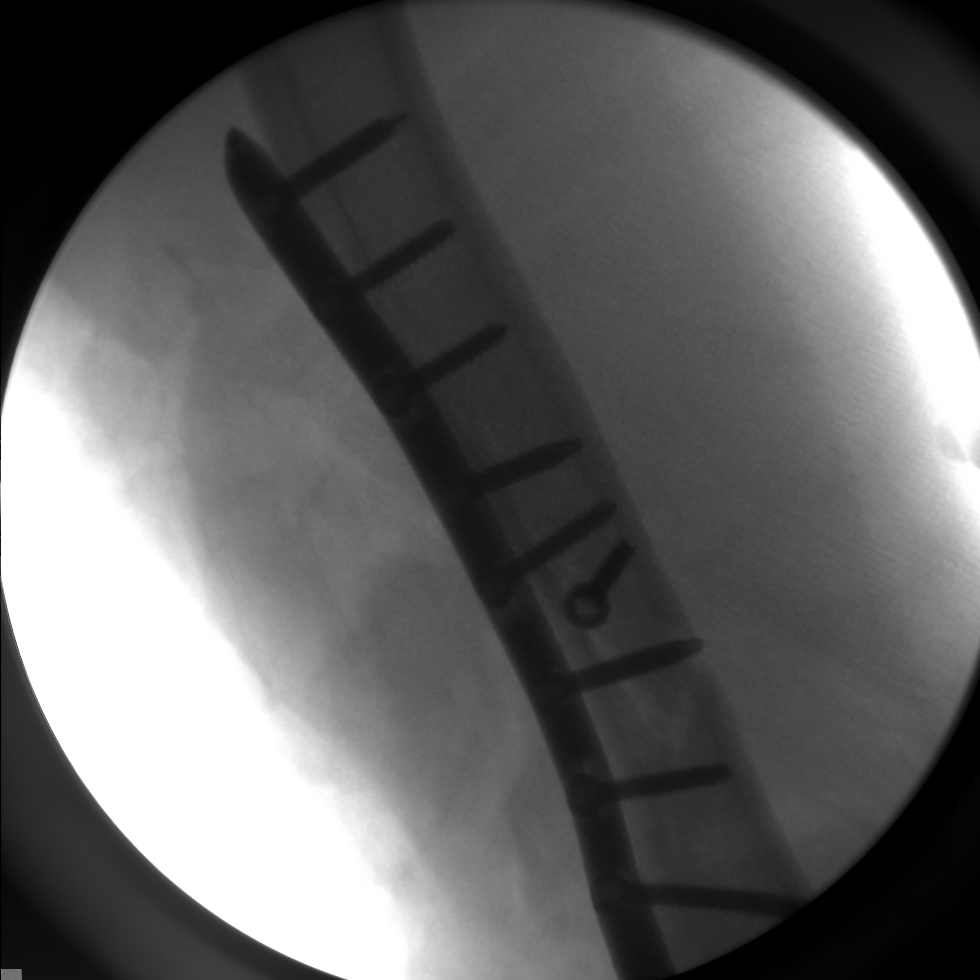
[im 3/6]
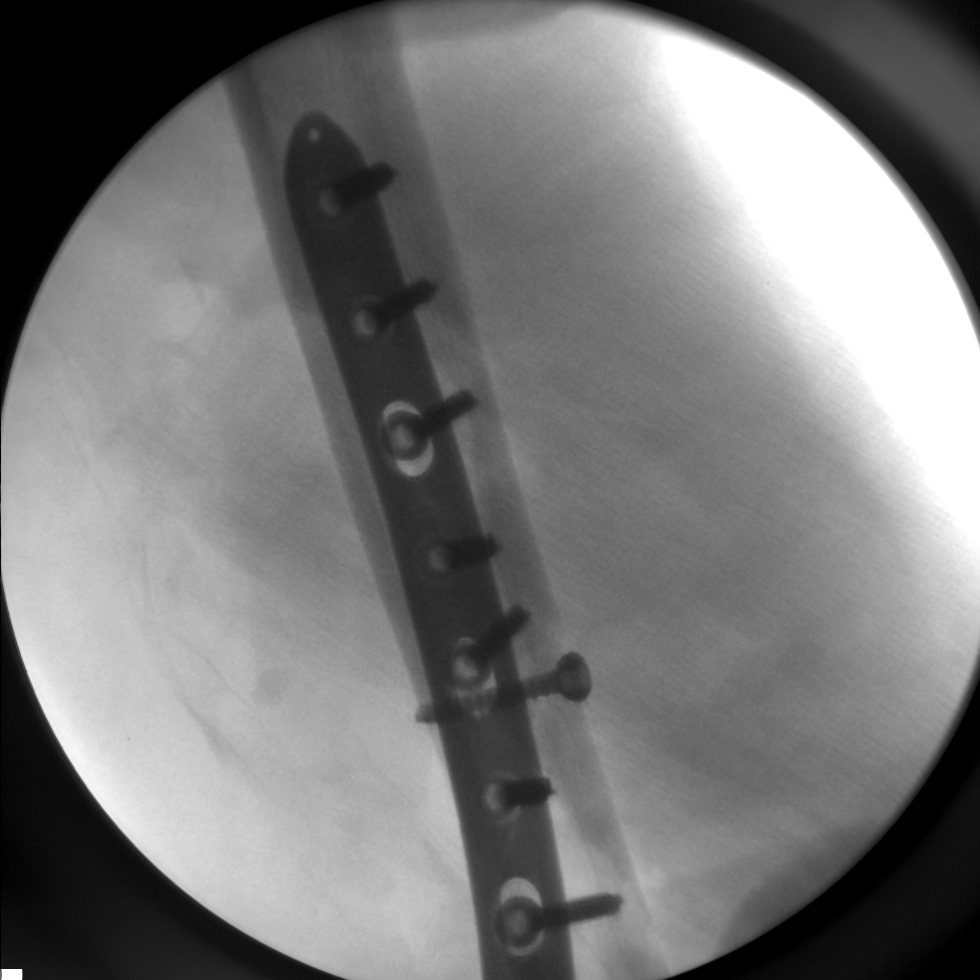
[im 4/6]
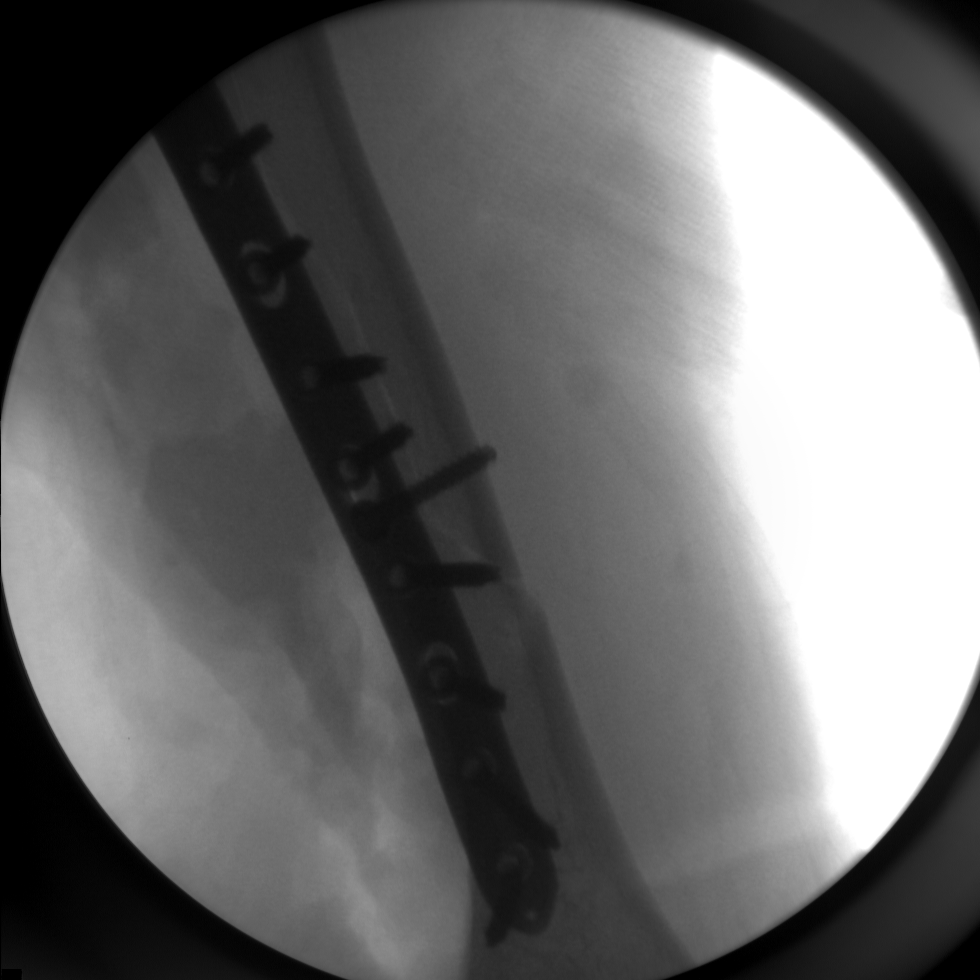
[im 5/6]
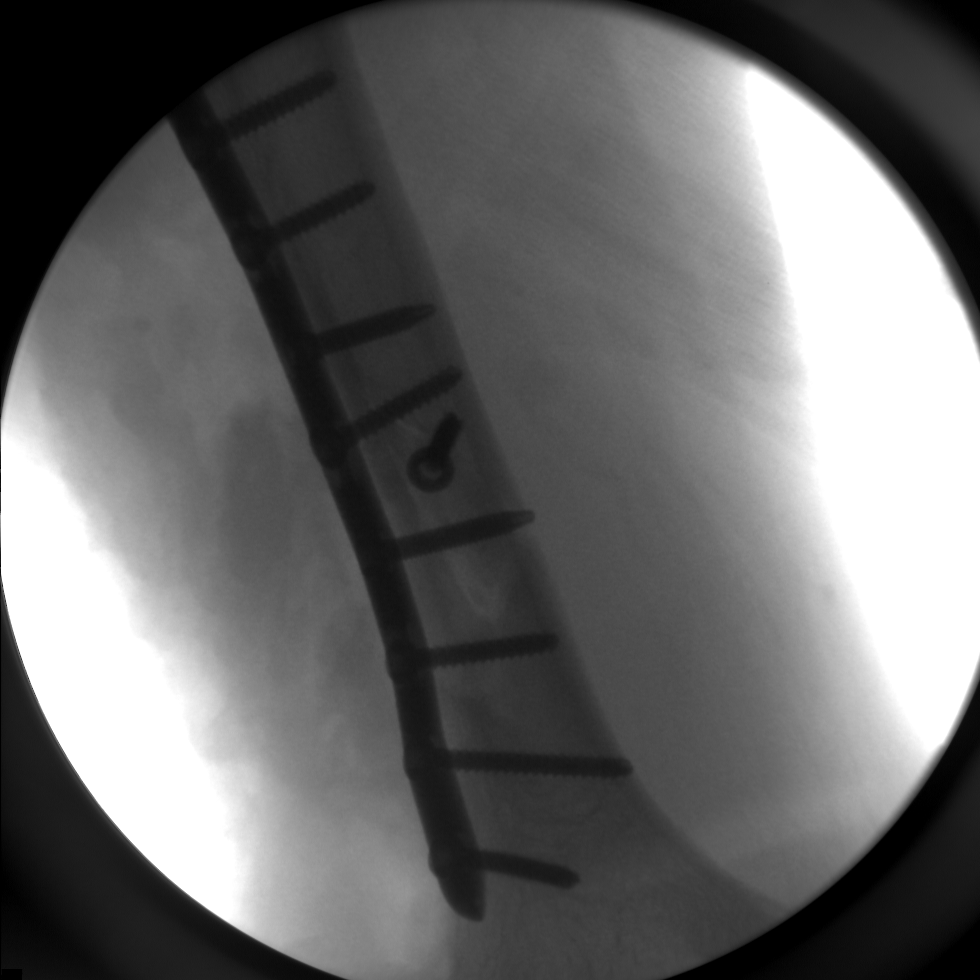
[im 6/6]
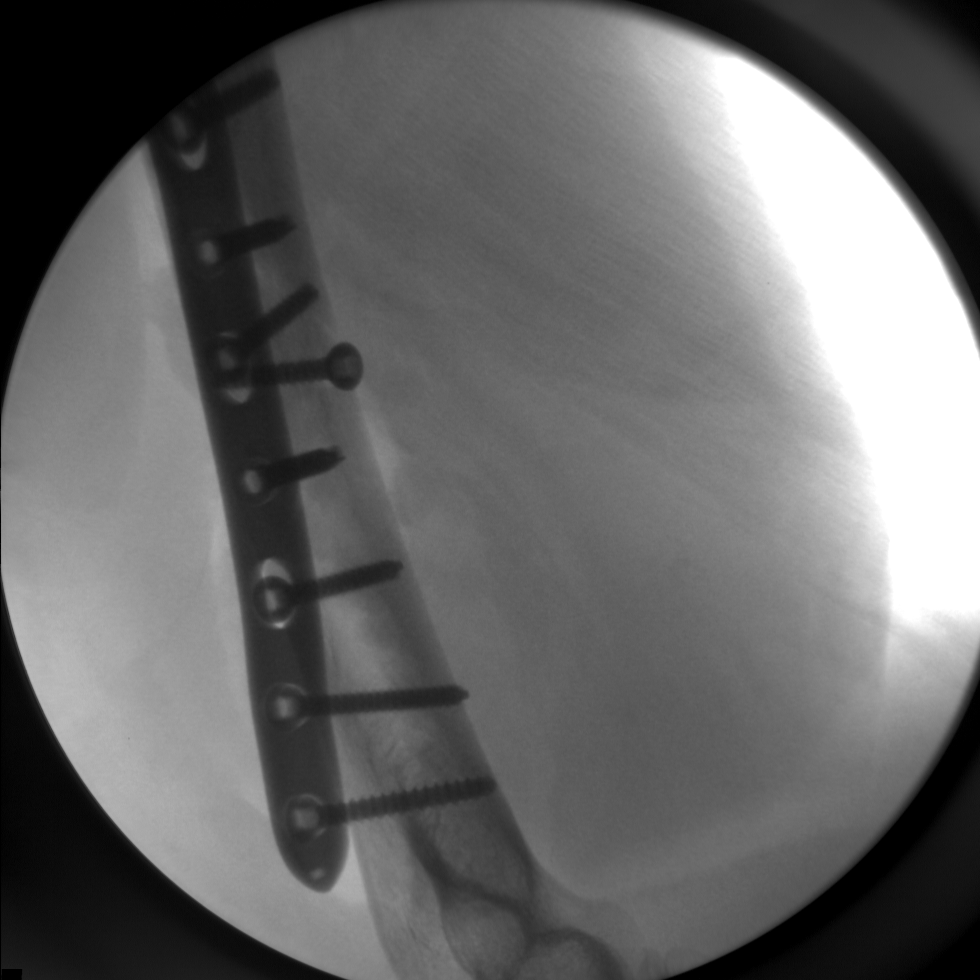

[6 of 6 positions shown; findings below may reference images not displayed]

FINDINGS: Six fluoroscopic C-arm images are provided from the OR. These
demonstrate successful placement of a plate and screws across the
patient's slightly comminuted distal humeral diaphyseal fracture.
The fracture is seen in grossly anatomic alignment. There is no
evidence of loosening at this time. The proximal and distal aspects
of the plate are not fully apposed to the bone.
IMPRESSION: Successful placement of plate and screws across the slightly
comminuted distal humeral diaphyseal fracture, seen in grossly
anatomic alignment. Incidental note of incomplete contact of the
proximal and distal ends of the plate with the underlying bone.

## 2018-02-22 ENCOUNTER — Encounter: Payer: Self-pay | Admitting: Physician Assistant
